# Patient Record
Sex: Female | Born: 1960 | Race: White | Hispanic: No | Marital: Married | Smoking: Never smoker
Health system: Southern US, Community
[De-identification: ages and names within clinical notes are randomized; demographics above are authoritative.]

## PROBLEM LIST (undated history)

## (undated) DIAGNOSIS — Q2112 Patent foramen ovale: Secondary | ICD-10-CM

## (undated) DIAGNOSIS — G629 Polyneuropathy, unspecified: Secondary | ICD-10-CM

## (undated) DIAGNOSIS — D649 Anemia, unspecified: Secondary | ICD-10-CM

## (undated) DIAGNOSIS — Q211 Atrial septal defect: Secondary | ICD-10-CM

## (undated) DIAGNOSIS — I639 Cerebral infarction, unspecified: Secondary | ICD-10-CM

## (undated) DIAGNOSIS — I499 Cardiac arrhythmia, unspecified: Secondary | ICD-10-CM

## (undated) DIAGNOSIS — R55 Syncope and collapse: Secondary | ICD-10-CM

## (undated) HISTORY — DX: Patent foramen ovale: Q21.12

## (undated) HISTORY — DX: Syncope and collapse: R55

## (undated) HISTORY — DX: Polyneuropathy, unspecified: G62.9

## (undated) HISTORY — DX: Cardiac arrhythmia, unspecified: I49.9

## (undated) HISTORY — DX: Atrial septal defect: Q21.1

## (undated) HISTORY — DX: Cerebral infarction, unspecified: I63.9

## (undated) HISTORY — PX: TONSILLECTOMY: SUR1361

## (undated) HISTORY — DX: Anemia, unspecified: D64.9

---

## 2007-12-04 DIAGNOSIS — I639 Cerebral infarction, unspecified: Secondary | ICD-10-CM

## 2007-12-04 HISTORY — DX: Cerebral infarction, unspecified: I63.9

## 2008-01-31 ENCOUNTER — Ambulatory Visit: Payer: Self-pay | Admitting: Internal Medicine

## 2008-03-07 ENCOUNTER — Other Ambulatory Visit: Payer: Self-pay

## 2008-03-07 ENCOUNTER — Inpatient Hospital Stay: Payer: Self-pay | Admitting: Internal Medicine

## 2010-09-20 ENCOUNTER — Emergency Department: Payer: Self-pay | Admitting: Unknown Physician Specialty

## 2010-10-06 ENCOUNTER — Ambulatory Visit: Payer: Self-pay | Admitting: Unknown Physician Specialty

## 2010-10-11 LAB — PATHOLOGY REPORT

## 2010-10-16 ENCOUNTER — Ambulatory Visit: Payer: Self-pay | Admitting: Unknown Physician Specialty

## 2010-10-17 ENCOUNTER — Ambulatory Visit: Payer: Self-pay | Admitting: Family Medicine

## 2010-11-16 ENCOUNTER — Ambulatory Visit: Payer: Self-pay | Admitting: Unknown Physician Specialty

## 2013-04-16 ENCOUNTER — Ambulatory Visit: Payer: Self-pay | Admitting: Obstetrics and Gynecology

## 2013-04-16 LAB — CBC
HCT: 34.7 % — ABNORMAL LOW (ref 35.0–47.0)
MCHC: 32.7 g/dL (ref 32.0–36.0)
MCV: 81 fL (ref 80–100)
RBC: 4.3 10*6/uL (ref 3.80–5.20)
RDW: 14.6 % — ABNORMAL HIGH (ref 11.5–14.5)

## 2013-04-28 ENCOUNTER — Ambulatory Visit: Payer: Self-pay | Admitting: Obstetrics and Gynecology

## 2014-03-29 ENCOUNTER — Ambulatory Visit: Payer: Self-pay | Admitting: Family Medicine

## 2014-08-11 ENCOUNTER — Telehealth: Payer: Self-pay | Admitting: *Deleted

## 2014-08-11 DIAGNOSIS — I471 Supraventricular tachycardia: Secondary | ICD-10-CM

## 2014-08-11 DIAGNOSIS — R0602 Shortness of breath: Secondary | ICD-10-CM

## 2014-08-11 NOTE — Telephone Encounter (Signed)
Per Dr Arida 

## 2014-08-19 ENCOUNTER — Ambulatory Visit (INDEPENDENT_AMBULATORY_CARE_PROVIDER_SITE_OTHER): Payer: Federal, State, Local not specified - PPO | Admitting: Cardiovascular Disease

## 2014-08-19 ENCOUNTER — Encounter: Payer: Self-pay | Admitting: Cardiovascular Disease

## 2014-08-19 ENCOUNTER — Other Ambulatory Visit (INDEPENDENT_AMBULATORY_CARE_PROVIDER_SITE_OTHER): Payer: Federal, State, Local not specified - PPO

## 2014-08-19 ENCOUNTER — Other Ambulatory Visit: Payer: Self-pay

## 2014-08-19 VITALS — BP 114/82 | HR 64 | Ht 70.0 in | Wt 195.0 lb

## 2014-08-19 DIAGNOSIS — Q2112 Patent foramen ovale: Secondary | ICD-10-CM

## 2014-08-19 DIAGNOSIS — R002 Palpitations: Secondary | ICD-10-CM

## 2014-08-19 DIAGNOSIS — I498 Other specified cardiac arrhythmias: Secondary | ICD-10-CM

## 2014-08-19 DIAGNOSIS — Q211 Atrial septal defect: Secondary | ICD-10-CM

## 2014-08-19 DIAGNOSIS — R0602 Shortness of breath: Secondary | ICD-10-CM

## 2014-08-19 DIAGNOSIS — Q2111 Secundum atrial septal defect: Secondary | ICD-10-CM

## 2014-08-19 DIAGNOSIS — I471 Supraventricular tachycardia: Secondary | ICD-10-CM

## 2014-08-19 NOTE — Patient Instructions (Signed)
Your physician has recommended that you wear a 30 day event monitor. Event monitors are medical devices that record the heart's electrical activity. Doctors most often [place] these monitors to diagnose arrhythmias. Arrhythmias are problems with the speed or rhythm of the heartbeat. The monitor is a small, portable device. You can wear one while you do your normal daily activities. This is usually used to diagnose what is causing palpitations/syncope (passing out).  Your physician has recommended you make the following change in your medication:  Stop Toprol   Your physician recommends that you schedule a follow-up appointment in:  As needed

## 2014-08-24 ENCOUNTER — Encounter: Payer: Self-pay | Admitting: Cardiovascular Disease

## 2014-08-27 ENCOUNTER — Encounter: Payer: Self-pay | Admitting: Cardiovascular Disease

## 2014-08-27 DIAGNOSIS — Q2112 Patent foramen ovale: Secondary | ICD-10-CM | POA: Insufficient documentation

## 2014-08-27 DIAGNOSIS — Q211 Atrial septal defect: Secondary | ICD-10-CM | POA: Insufficient documentation

## 2014-08-27 DIAGNOSIS — R002 Palpitations: Secondary | ICD-10-CM | POA: Insufficient documentation

## 2014-08-27 NOTE — Assessment & Plan Note (Signed)
This was noted during previous workup of stroke in 2009. However, this was not evident on today's echocardiogram. Thus, if PFO is present, it's likely small.

## 2014-08-27 NOTE — Progress Notes (Signed)
   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 She is here today for evaluation of intermittent tachycardia and palpitations. She suffered from an MCA stroke in 2009 of unclear etiology. However, she was found to have a PFO. She did not undergo goes up. As a result of stroke, she has continued symptoms of left-sided numbness and occasionally decreased concentration. Over the last few months, she has been having intermittent episodes of palpitations mostly at rest with mild dizziness but no syncope or presyncope. No chest pain or lower extremity edema. These episodes did not happen on a daily basis. She has been using metoprolol from her husband with some relief of symptoms but not complete resolution. She describes shortness of breath during these episodes of tachycardia. There is no family history of coronary artery disease or sudden death.    No Known Allergies   No current outpatient prescriptions on file prior to visit.   No current facility-administered medications on file prior to visit.     Past Medical History  Diagnosis Date  . Syncope and collapse   . Arrhythmia   . Anemia   . Neuropathy   . Stroke 2009  . PFO (patent foramen ovale)      Past Surgical History  Procedure Laterality Date  . Tonsillectomy       History reviewed. No pertinent family history.   History   Social History  . Marital Status: Married    Spouse Name: N/A    Number of Children: N/A  . Years of Education: N/A   Occupational History  . Not on file.   Social History Main Topics  . Smoking status: Never Smoker   . Smokeless tobacco: Not on file  . Alcohol Use: No  . Drug Use: No  . Sexual Activity: Not on file   Other Topics Concern  . Not on file   Social History Narrative  . No narrative on file     ROS A 10 point review of system was performed. It is negative other than that mentioned in the history of present illness.   PHYSICAL EXAM   BP 114/82   Pulse 64  Ht  (1.778 m)  Wt 195 lb (88.451 kg)  BMI 27.98 kg/m2 Constitutional: She is oriented to person, place, and time. She appears well-developed and well-nourished. No distress.  HENT: No nasal discharge.  Head: Normocephalic and atraumatic.  Eyes: Pupils are equal and round. No discharge.  Neck: Normal range of motion. Neck supple. No JVD present. No thyromegaly present.  Cardiovascular: Normal rate, regular rhythm, normal heart sounds. Exam reveals no gallop and no friction rub. No murmur heard.  Pulmonary/Chest: Effort normal and breath sounds normal. No stridor. No respiratory distress. She has no wheezes. She has no rales. She exhibits no tenderness.  Abdominal: Soft. Bowel sounds are normal. She exhibits no distension. There is no tenderness. There is no rebound and no guarding.  Musculoskeletal: Normal range of motion. She exhibits no edema and no tenderness.  Neurological: She is alert and oriented to person, place, and time. Coordination normal.  Skin: Skin is warm and dry. No rash noted. She is not diaphoretic. No erythema. No pallor.  Psychiatric: She has a normal mood and affect. Her behavior is normal. Judgment and thought content normal.     EKG: Normal sinus rhythm.   ASSESSMENT AND PLAN

## 2014-08-27 NOTE — Assessment & Plan Note (Signed)
The symptoms are suggestive of paroxysmal supraventricular tachycardia. The episodes are not happening frequently enough to be captured on a Holter monitor. Thus, I recommend a 30 day outpatient telemetry. Given her previous history of PFO, an echocardiogram was done and showed no structural heart abnormalities. This overall was normal.

## 2014-08-30 ENCOUNTER — Telehealth: Payer: Self-pay | Admitting: *Deleted

## 2014-08-30 NOTE — Telephone Encounter (Signed)
Patient stated that with her current work schedule she does not think the event monitor will work for her  She has no time to charge it during her day  Patient works 1 week on then 1 week off  Discussed patient issue with Dr. Kirke Corin  Dr. Kirke Corin instructed patient to wear holter on her weeks off for two weeks And not to wear it on her 2 weeks on   Patient verbalized understanding

## 2014-08-31 DIAGNOSIS — I498 Other specified cardiac arrhythmias: Secondary | ICD-10-CM | POA: Diagnosis not present

## 2014-10-11 ENCOUNTER — Ambulatory Visit (INDEPENDENT_AMBULATORY_CARE_PROVIDER_SITE_OTHER): Payer: Federal, State, Local not specified - PPO

## 2014-10-11 ENCOUNTER — Telehealth: Payer: Self-pay | Admitting: *Deleted

## 2014-10-11 ENCOUNTER — Other Ambulatory Visit: Payer: Self-pay

## 2014-10-11 DIAGNOSIS — R002 Palpitations: Secondary | ICD-10-CM

## 2014-10-11 NOTE — Telephone Encounter (Signed)
Informed patient per Dr. Kirke CorinArida event monitor showed: Normal sinus rhythm with no significant arrhythmia Patient verbalized understanding

## 2014-11-08 ENCOUNTER — Ambulatory Visit: Payer: Self-pay | Admitting: Otolaryngology

## 2014-12-30 ENCOUNTER — Ambulatory Visit: Payer: Self-pay | Admitting: Obstetrics and Gynecology

## 2014-12-30 LAB — COMPREHENSIVE METABOLIC PANEL WITH GFR
Albumin: 3.6 g/dL
Alkaline Phosphatase: 81 U/L
Anion Gap: 5 — ABNORMAL LOW
BUN: 23 mg/dL — ABNORMAL HIGH
Bilirubin,Total: 0.6 mg/dL
Calcium, Total: 8.5 mg/dL
Chloride: 106 mmol/L
Co2: 30 mmol/L
Creatinine: 0.82 mg/dL
EGFR (African American): >60
EGFR (Non-African Amer.): >60
Glucose: 93 mg/dL
Osmolality: 285
Potassium: 3.7 mmol/L
SGOT(AST): 27 U/L
SGPT (ALT): 18 U/L
Sodium: 141 mmol/L
Total Protein: 7 g/dL

## 2014-12-30 LAB — CBC
HCT: 42.1 %
HGB: 13.4 g/dL
MCH: 29.8 pg
MCHC: 31.8 g/dL — ABNORMAL LOW
MCV: 94 fL
Platelet: 250 x10 3/mm 3
RBC: 4.49 X10 6/mm 3
RDW: 12.8 %
WBC: 6.5 x10 3/mm 3

## 2014-12-31 LAB — RAPID HIV-1/2 QL/CONFIRM: HIV-1/2,Rapid Ql: NEGATIVE

## 2015-01-03 ENCOUNTER — Ambulatory Visit: Payer: Self-pay | Admitting: Obstetrics and Gynecology

## 2015-01-04 LAB — HCG, QUANTITATIVE, PREGNANCY

## 2015-03-25 NOTE — Op Note (Signed)
PATIENT NAME:  Amber Roberson, Merna MR#:  [id number] DATE OF BIRTH:  March 17, 1961  DATE OF PROCEDURE:  04/28/2013  PREOPERATIVE DIAGNOSES: 1.  Menorrhagia.  2.  Endometrial polyps.   POSTOPERATIVE DIAGNOSES: 1.  Menorrhagia.  2.  Endometrial polyps.  3.  Pelvic organ prolapse.   OPERATIVE PROCEDURES: 1.  Hysteroscopy.  2.  Dilation and curettage of the endometrium.   SURGEON: Herold HarmsMartin A Lestine Rahe, MD   FIRST ASSISTANT:  None.   ANESTHESIA: General endotracheal.   INDICATIONS: The patient is a 54 year old married white female, para 2-0-0-2, last menstrual period 04/10/2013, presents for surgical management of menorrhagia to anemia. The patient has had heavy cyclic bleeding on a monthly basis with development of anemia requiring iron supplementation. Preoperative ultrasound demonstrated a thickened endometrium with multiple endometrial polyps.   FINDINGS AT SURGERY: Revealed the uterus to be anteverted, mobile, and with a normal uterine descensus to within 2 cm of the introitus. The bony pelvis is gynecoid. The patient is an excellent transvaginal hysterectomy candidate. On hysteroscopy, multiple endometrial polyps were seen.   DESCRIPTION OF PROCEDURE: The patient was brought to the Operating Room where she was placed in the supine position. General anesthesia was induced without difficulty. She was placed in dorsal lithotomy position using the candy cane stirrups. A Betadine perineal intravaginal prep and drape was performed in the standard fashion. A red Robinson catheter was used to drain 50 mL of urine from the bladder. A weighted speculum was placed into the vagina, and a single-tooth tenaculum was placed on the anterior lip of the cervix. The uterus was sounded to 8 cm. It was noted to be anteverted. The [facility] Hysteroscope using lactated Ringer's as irrigant was used for the hysteroscopy.  The endocervical canal was dilated with Hanks dilators up to a #20 [place] caliber. The [facility] Hysteroscope  verified multiple endometrial polyps which were photo documented. The curettage was then performed with both smooth and serrated curettes. Stone polyp forceps were used to extract endometrial polyps. Repeat hysteroscopy revealed excellent removal of tissue with minimal tissue left behind. Upon completion of the procedure, all instrumentation was removed from the vagina. The patient was then awakened, extubated, and taken to the recovery room in satisfactory condition.   ESTIMATED BLOOD LOSS: 25 mL.   COMPLICATIONS: None.   COUNTS:  All instruments, needle, and sponge counts were verified as correct.   ____________________________ Prentice DockerMartin A. Shalan Neault, MD mad:cb D: 04/28/2013 14:15:01 ET T: 04/28/2013 16:02:32 ET JOB#: [id number]  cc: Daphine DeutscherMartin A. Adilynne Fitzwater, MD, <Dictator> Prentice DockerMARTIN A Jillann Charette MD ELECTRONICALLY SIGNED 05/12/2013 15:05

## 2015-03-28 LAB — SURGICAL PATHOLOGY

## 2015-04-03 NOTE — Op Note (Signed)
PATIENT NAME:  Amber Roberson, Amber Roberson MR#:  [id number] DATE OF BIRTH:  1960/12/28  DATE OF PROCEDURE:  01/03/2015  PREOPERATIVE DIAGNOSES: 1.  Menorrhagia to anemia.  2.  Bilateral labia majora cysts.   POSTOPERATIVE DIAGNOSES:  1.  Menorrhagia to anemia.  2.  Bilateral labia majora cysts.   OPERATIVE PROCEDURE:  1.  Hysteroscopy/dilation and curettage with NovaSure endometrial ablation.  2.  Excisional biopsy of vulvar lesions, right and left labia majora.   SURGEON: Prentice DockerMartin A. Alejos Reinhardt, M.D.   FIRST ASSISTANT: Mare LoanJan Beard, PA-S  ANESTHESIA: General.   INDICATIONS: The patient is a 54 year old white female with history of menorrhagia to anemia who presents for surgical management of this condition. Preoperative ultrasound did demonstrate a small possible submucosal fibroid. Preoperative endometrial biopsy was benign.   FINDINGS AT SURGERY: Revealed a gynecoid pelvis. The uterus was sounded to 8.5 cm. There was good descensus with tenaculum traction to within several centimeters of the introitus. On hysteroscopy, no abnormal lesions were identified intraoperatively. The right and left labia majora contained multiple inclusion cysts and sebaceous cysts which had to be excised. The extent of the incision on the right was 6 cm. The extent of the excision on the left labia majora was 4 cm.   DESCRIPTION OF THE PROCEDURE: The patient was brought to the operating room where she was placed in the supine position. General anesthesia was induced without difficulty. She was placed in the dorsal lithotomy position using candy-cane stirrups. A Betadine perineal, intravaginal prep and drape was performed in standard fashion. A red Robison catheter was used to drain 50 mL of urine from the bladder. A weighted speculum was placed into the vagina, and a single-tooth tenaculum was placed on the anterior lip of the cervix. The uterus was sounded to 8.5 cm. The cervix was dilated using Hegar dilators up to a #8-French.  The cervical length was 4 cm. Hysteroscopy was performed with the [facility] hysteroscope using lactated Ringer's as irrigant. The above-noted findings were photo documented. Next, the NovaSure instrument was opened, placed, and cavity test performed, which was successful. Ablation was then performed through standard technique with excellent burn of the endometrial cavity. The cavity length was 4.5 cm. The cavity width was 4.3 cm. The power was 109 joules. Duration of the burn was 1 minute and 19 seconds. Following completion of the NovaSure ablation, repeat hysteroscopy demonstrated a good thermal burn of the endometrial cavity. Following completion of the hysteroscopy, the excisional biopsies of the vulva were performed. The right labia majora was infiltrated with 1% lidocaine with 1:100,000 epinephrine. Incision was made through the skin and actually involved several of the cysts. Cyst sebum was evacuated. The cyst wall was then excised using sharp dissection. This was sent to pathology. The total length of the incision on the right labia majora was 6 cm. Hemostasis was maintained with needle tip Bovie cautery. Good hemostasis was noted. The 3-0 Vicryl suture was then used with a running continuous stitch to reapproximate the subcutaneous tissues. This was followed by a 4-0 Vicryl suture, continuous running closure of the skin. Dermabond glue was placed over this incision and hemostasis was intact. A similar procedure was performed on the left labia majora. An elliptical excision incorporating approximately 4 cm in length was made. The skin and cyst were removed intact. These were sent to pathology. The incision was then closed with a 4-0 Vicryl Keith needle in a simple running manner. Dermabond glue was also placed over this incision. Upon completion of the  procedure, the patient was awakened, mobilized, and taken to the recovery room in satisfactory condition.   ESTIMATED BLOOD LOSS: Minimal.   COMPLICATIONS:  None.   COUNTS: All instrument, needle, and sponge counts were verified as correct.  ____________________________ Prentice Docker. Anslee Micheletti, MD mad:sb D: 01/03/2015 11:27:50 ET T: 01/03/2015 11:42:46 ET JOB#: [id number]  cc: Daphine Deutscher A. Daoud Lobue, MD, <Dictator> Prentice Docker Jshon Ibe MD ELECTRONICALLY SIGNED 01/10/2015 13:01

## 2015-11-16 ENCOUNTER — Other Ambulatory Visit: Payer: Self-pay | Admitting: Family Medicine

## 2015-11-16 DIAGNOSIS — Z1231 Encounter for screening mammogram for malignant neoplasm of breast: Secondary | ICD-10-CM

## 2015-12-07 ENCOUNTER — Ambulatory Visit: Payer: Federal, State, Local not specified - PPO

## 2015-12-09 ENCOUNTER — Ambulatory Visit
Admission: RE | Admit: 2015-12-09 | Discharge: 2015-12-09 | Disposition: A | Discharge status: Home / Self Care | Payer: Federal, State, Local not specified - PPO | Source: Ambulatory Visit | Attending: Family Medicine | Admitting: Family Medicine

## 2015-12-09 DIAGNOSIS — Z1231 Encounter for screening mammogram for malignant neoplasm of breast: Secondary | ICD-10-CM

## 2016-01-17 ENCOUNTER — Other Ambulatory Visit: Payer: Self-pay | Admitting: Family Medicine

## 2016-01-17 DIAGNOSIS — R928 Other abnormal and inconclusive findings on diagnostic imaging of breast: Secondary | ICD-10-CM

## 2016-01-18 ENCOUNTER — Ambulatory Visit
Admission: RE | Admit: 2016-01-18 | Discharge: 2016-01-18 | Disposition: A | Discharge status: Home / Self Care | Payer: Federal, State, Local not specified - PPO | Source: Ambulatory Visit | Attending: Family Medicine | Admitting: Family Medicine

## 2016-01-18 DIAGNOSIS — R928 Other abnormal and inconclusive findings on diagnostic imaging of breast: Secondary | ICD-10-CM

## 2016-01-18 DIAGNOSIS — N6489 Other specified disorders of breast: Secondary | ICD-10-CM | POA: Diagnosis present

## 2016-09-11 ENCOUNTER — Other Ambulatory Visit: Payer: Self-pay | Admitting: Family Medicine

## 2016-09-11 DIAGNOSIS — R928 Other abnormal and inconclusive findings on diagnostic imaging of breast: Secondary | ICD-10-CM

## 2016-09-13 ENCOUNTER — Ambulatory Visit
Admission: RE | Admit: 2016-09-13 | Discharge: 2016-09-13 | Disposition: A | Discharge status: Home / Self Care | Payer: Federal, State, Local not specified - PPO | Source: Ambulatory Visit | Attending: Family Medicine | Admitting: Family Medicine

## 2016-09-13 DIAGNOSIS — R928 Other abnormal and inconclusive findings on diagnostic imaging of breast: Secondary | ICD-10-CM

## 2017-04-03 ENCOUNTER — Other Ambulatory Visit: Payer: Self-pay | Admitting: Family Medicine

## 2017-04-03 DIAGNOSIS — M545 Low back pain, unspecified: Secondary | ICD-10-CM

## 2017-04-03 DIAGNOSIS — M79605 Pain in left leg: Secondary | ICD-10-CM

## 2017-04-08 ENCOUNTER — Ambulatory Visit
Admission: RE | Admit: 2017-04-08 | Discharge: 2017-04-08 | Disposition: A | Discharge status: Home / Self Care | Payer: Federal, State, Local not specified - PPO | Source: Ambulatory Visit | Attending: Family Medicine | Admitting: Family Medicine

## 2017-04-08 DIAGNOSIS — M545 Low back pain: Secondary | ICD-10-CM | POA: Insufficient documentation

## 2017-04-08 DIAGNOSIS — M5136 Other intervertebral disc degeneration, lumbar region: Secondary | ICD-10-CM | POA: Diagnosis not present

## 2017-04-08 DIAGNOSIS — M79605 Pain in left leg: Secondary | ICD-10-CM

## 2017-05-07 ENCOUNTER — Other Ambulatory Visit: Payer: Self-pay | Admitting: Neurology

## 2017-05-07 DIAGNOSIS — I69319 Unspecified symptoms and signs involving cognitive functions following cerebral infarction: Secondary | ICD-10-CM

## 2017-05-07 DIAGNOSIS — R292 Abnormal reflex: Secondary | ICD-10-CM

## 2017-05-14 ENCOUNTER — Other Ambulatory Visit: Payer: Federal, State, Local not specified - PPO

## 2017-05-14 ENCOUNTER — Ambulatory Visit: Payer: Federal, State, Local not specified - PPO

## 2017-05-20 ENCOUNTER — Ambulatory Visit
Admission: RE | Admit: 2017-05-20 | Discharge: 2017-05-20 | Disposition: A | Discharge status: Home / Self Care | Payer: Federal, State, Local not specified - PPO | Source: Ambulatory Visit | Attending: Neurology | Admitting: Neurology

## 2017-05-20 DIAGNOSIS — M5030 Other cervical disc degeneration, unspecified cervical region: Secondary | ICD-10-CM | POA: Insufficient documentation

## 2017-05-20 DIAGNOSIS — I69319 Unspecified symptoms and signs involving cognitive functions following cerebral infarction: Secondary | ICD-10-CM

## 2017-05-20 DIAGNOSIS — M5021 Other cervical disc displacement,  high cervical region: Secondary | ICD-10-CM | POA: Insufficient documentation

## 2017-05-20 DIAGNOSIS — R292 Abnormal reflex: Secondary | ICD-10-CM

## 2017-11-20 ENCOUNTER — Other Ambulatory Visit: Payer: Self-pay | Admitting: Internal Medicine

## 2017-11-20 DIAGNOSIS — Z1231 Encounter for screening mammogram for malignant neoplasm of breast: Secondary | ICD-10-CM

## 2017-11-28 ENCOUNTER — Other Ambulatory Visit: Payer: Self-pay | Admitting: Orthopedic Surgery

## 2017-11-28 DIAGNOSIS — M5416 Radiculopathy, lumbar region: Secondary | ICD-10-CM

## 2017-12-04 ENCOUNTER — Ambulatory Visit
Admission: RE | Admit: 2017-12-04 | Discharge: 2017-12-04 | Disposition: A | Discharge status: Home / Self Care | Payer: Federal, State, Local not specified - PPO | Source: Ambulatory Visit | Attending: Orthopedic Surgery | Admitting: Orthopedic Surgery

## 2017-12-04 DIAGNOSIS — M47896 Other spondylosis, lumbar region: Secondary | ICD-10-CM | POA: Diagnosis not present

## 2017-12-04 DIAGNOSIS — M5416 Radiculopathy, lumbar region: Secondary | ICD-10-CM

## 2017-12-04 DIAGNOSIS — M1288 Other specific arthropathies, not elsewhere classified, other specified site: Secondary | ICD-10-CM | POA: Insufficient documentation

## 2017-12-04 DIAGNOSIS — M5126 Other intervertebral disc displacement, lumbar region: Secondary | ICD-10-CM | POA: Diagnosis not present

## 2017-12-16 ENCOUNTER — Ambulatory Visit
Admission: RE | Admit: 2017-12-16 | Discharge: 2017-12-16 | Disposition: A | Discharge status: Home / Self Care | Payer: Federal, State, Local not specified - PPO | Source: Ambulatory Visit | Attending: Internal Medicine | Admitting: Internal Medicine

## 2017-12-16 DIAGNOSIS — Z1231 Encounter for screening mammogram for malignant neoplasm of breast: Secondary | ICD-10-CM | POA: Diagnosis present

## 2018-02-20 ENCOUNTER — Ambulatory Visit: Payer: Federal, State, Local not specified - PPO | Attending: Orthopedic Surgery

## 2018-02-20 ENCOUNTER — Other Ambulatory Visit: Payer: Self-pay

## 2018-02-20 DIAGNOSIS — M545 Low back pain: Secondary | ICD-10-CM | POA: Insufficient documentation

## 2018-02-20 DIAGNOSIS — M5416 Radiculopathy, lumbar region: Secondary | ICD-10-CM | POA: Insufficient documentation

## 2018-02-20 DIAGNOSIS — G8929 Other chronic pain: Secondary | ICD-10-CM | POA: Diagnosis present

## 2018-02-20 DIAGNOSIS — M6281 Muscle weakness (generalized): Secondary | ICD-10-CM | POA: Diagnosis present

## 2018-02-20 NOTE — Therapy (Signed)
[facility] MAIN [facility] SERVICES [address] [place], [place], [address] Phone: [phone]   Fax:  [phone]  Physical Therapy Evaluation  Patient Details  Name: Amber Roberson MRN: [id number] Date of Birth: [birth year] Referring Provider: Dr. Martha Clan   Encounter Date: 02/20/2018  PT End of Session - 02/20/18 1652    Visit Number  1    Number of Visits  12    Date for PT Re-Evaluation  04/03/18    PT Start Time  1347    PT Stop Time  1430    PT Time Calculation (min)  43 min    Activity Tolerance  Patient limited by pain    Behavior During Therapy  Anxious;Agitated       Past Medical History:  Diagnosis Date  . Anemia   . Arrhythmia   . Neuropathy   . PFO (patent foramen ovale)   . Stroke [facility]) 2009  . Syncope and collapse     Past Surgical History:  Procedure Laterality Date  . TONSILLECTOMY      There were no vitals filed for this visit.     OUTCOME MEASURES: TEST Outcome Interpretation  5 times sit<>stand 12 sec >56 yo, >15 sec indicates increased risk for falls  Worst VAS 7/10   MODI 34% Mod disability  LEFS 60/80                 [facility] PT Assessment - 02/20/18 0001      Assessment   Medical Diagnosis  lumbar radiculopathy    Referring Provider  Dr. Martha Clan    Onset Date/Surgical Date  11/22/17    Hand Dominance  Right    Next MD Visit  4-5 weeks    Prior Therapy  not for back pain      Precautions   Precautions  Other (comment) history of stroke, sensation limited in LLE      Restrictions   Weight Bearing Restrictions  No      Balance Screen   Has the patient fallen in the past 6 months  -- patient did not answer    Has the patient had a decrease in activity level because of a fear of falling?   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       Cognition   Overall Cognitive Status  History of cognitive impairments - at baseline    Attention  Alternating    Alternating Attention  Impaired    Memory  Impaired    Memory Impairment  Decreased short term memory    Behaviors  Impulsive;Verbal agitation;Poor frustration tolerance;Perseveration Unable to answer questions, continue repeating      Observation/Other Assessments   Observations  Agitated, tearful when discussing pain, unable to sit still    Skin Integrity  intact    Focus on Therapeutic Outcomes (FOTO)   MODI, LEFS      Sensation   Light Touch  Impaired by gross assessment    Additional Comments  LLE sensation deficits      Coordination   Gross Motor Movements are Fluid and Coordinated  Yes  Posture/Postural Control   Posture/Postural Control  Postural limitations    Postural Limitations  Rounded Shoulders;Forward head;Weight shift left      Tone   Assessment Location  Left Lower Extremity      ROM / Strength   AROM / PROM / Strength  AROM;Strength      AROM   Overall AROM   Within functional limits for tasks performed;Deficits;Due to pain    AROM Assessment Site  Lumbar    Lumbar Flexion  45 painful    Lumbar Extension  10    Lumbar - Right Side Bend  25    Lumbar - Left Side Bend  20 painful    Lumbar - Right Rotation  19 painful    Lumbar - Left Rotation  25       Strength   Overall Strength  Deficits    Strength Assessment Site  Hip;Knee;Ankle;Lumbar    Right/Left Hip  Right;Left    Right Hip Flexion  4-/5    Right Hip Extension  2+/5    Right Hip ABduction  4-/5    Right Hip ADduction  4-/5    Left Hip Flexion  3+/5    Left Hip Extension  2/5    Left Hip ABduction  3+/5    Left Hip ADduction  3+/5    Right/Left Knee  Right;Left    Right Knee Flexion  4-/5    Right Knee Extension  4-/5    Left Knee Flexion  3+/5     Left Knee Extension  3+/5    Right/Left Ankle  Right;Left    Right Ankle Dorsiflexion  4-/5    Right Ankle Plantar Flexion  4-/5    Left Ankle Dorsiflexion  3+/5    Left Ankle Plantar Flexion  3+/5    Lumbar Flexion  2+/5    Lumbar Extension  2+/5      Flexibility   Soft Tissue Assessment /Muscle Length  yes    Hamstrings  limited bilaterally with R>L    Quadriceps  iliopsoas and quadricep tight bilaterally       Palpation   Spinal mobility  L4-5 and L5 S1 R UPA reproduce pain all lumbosacral Grade I-III UPA and CPAs hypomobile      Special Tests    Special Tests  Sacrolliac Tests;Leg LengthTest;Non-Organic Signs;Hip Special Tests;Lumbar    Lumbar Tests  FABER test;Slump Test;Straight Leg Raise    Sacroiliac Tests   Sacral Compression    Leg length test   Apparent;True    Non-Organic Signs  other changing of symptom location,    Hip Special Tests   Luisa Hart (FABER) Test;Hip Scouring      FABER test   findings  Negative    Side  Right      Slump test   Findings  Positive    Side  Right      Straight Leg Raise   Findings  Negative    Side   Right      Pelvic Dictraction   Findings  Negative      Pelvic Compression   Findings  Negative      Sacral Compression   Findings  -- painful one time, not painful the other time.       True   Length  equal      Apparent   Length  equal      Luisa Hart (FABER) Test   Findings  Negative      Hip Scouring   Findings  Negative      Bed Mobility   Bed Mobility  Supine to Sit;Sit to Supine    Supine to Sit  6: Modified independent (Device/Increase time) painful    Sit to Supine  6: Modified independent (Device/Increase time) painful      Transfers   Transfers  Sit to Stand;Stand to Sit    Sit to Stand  6: Modified independent (Device/Increase time);With upper extremity assist painful    Five time sit to stand comments   12 seconds    Stand to Sit  6: Modified independent (Device/Increase time) painful       Ambulation/Gait   Ambulation/Gait  Yes    Ambulation/Gait Assistance  7: Independent    Ambulation Distance (Feet)  10 Feet    Assistive device  None    Gait Pattern  Step-through pattern;Decreased arm swing - left;Decreased stride length;Decreased weight shift to right;Decreased trunk rotation    Ambulation Surface  Level;Indoor      LLE Tone   LLE Tone  Mild             Objective measurements completed on examination: See above findings.              PT Education - 02/20/18 1652    Education provided  Yes    Education Details  POC, neurological flossing    Person(s) Educated  Patient    Methods  Explanation;Demonstration;Verbal cues    Comprehension  Verbalized understanding;Returned demonstration;Need further instruction       PT Short Term Goals - 02/20/18 1659      PT SHORT TERM GOAL #1   Title  Patient will be independent in home exercise program to improve strength/mobility for better functional independence with ADLs.    Baseline  HEP to be given    Time  2    Period  Weeks    Status  New    Target Date  03/06/18      PT SHORT TERM GOAL #2   Title   Patient (< 69 years old) will complete five times sit to stand test in < 10 seconds indicating an increased LE strength and improved balance.    Baseline  3/21: 12 seconds    Time  2    Period  Weeks    Status  New    Target Date  03/06/18      PT SHORT TERM GOAL #3   Title  Patient will report a worst pain of 5/10 on VAS in R lumbosacral region  to improve tolerance with ADLs and reduced symptoms with activities.     Baseline  3/21: 7/10    Time  2    Period  Weeks    Status  New    Target Date  03/06/18      PT SHORT TERM GOAL #4   Title  Patient will increase lumbar extension strength to at least 3+/5 as to improve gross strength for sitting/standing tolerance with better erect posture for increased tolerance with ADLs.     Baseline  2+/5     Time  2    Period  Weeks    Status  New     Target Date  03/06/18        PT Long Term Goals - 02/20/18 1701      PT LONG TERM GOAL #1   Title  Patient will report a worst pain of 3/10 on VAS in R lumbosacral region  to improve tolerance with  ADLs and reduced symptoms with activities.     Baseline  3/21: 7/10    Time  6    Period  Weeks    Status  New    Target Date  04/03/18      PT LONG TERM GOAL #2   Title  Patient will tolerate sitting unsupported demonstrating erect sitting posture with minimal weight shift for 15+ minutes with maximum of 4/10 back pain to demonstrate improved back extensor strength and improved sitting tolerance.     Baseline  unable to tolerate    Time  6    Period  Weeks    Status  New    Target Date  04/03/18      PT LONG TERM GOAL #3   Title   Patient will reduce modified Oswestry score to <20 as to demonstrate minimal disability with ADLs including improved sleeping tolerance, walking/sitting tolerance etc for better mobility with ADLs.     Baseline  3/21: 34%    Time  6    Period  Weeks    Status  New    Target Date  04/03/18      PT LONG TERM GOAL #4   Title  Patient will demonstrate 4+/5 strength of lumbar and RLE to improve gross strength for sitting/standing tolerance with better erect posture for increased tolerance with ADLs.     Baseline  2+/5 lumbar, 4-/5 RLE    Time  6    Period  Weeks    Status  New    Target Date  02/20/18             Plan - 02/20/18 1653    Clinical Impression Statement  Patient is a pleasant 57 year old female who presents with lumbar radiculopathy and R low back pain.  Patient has a history of Stroke, PFO, and central pain syndrome with memory deficits. Outcomes of this evaluation may be slightly skewed due to patient's mental status and high anxiety level due to pain. L4-5, and L5 -S1 reproduced pain upon mobilization. Hypomobility of lumbosacral spine combined with excessive lumbar paraspinal muscle guarding and weak LE and postural musculature result  in impaired posture and increased pain. Neural gliding was positive in RLE. MODI=34%, LEFS 60/80, VAS worst pain 7/10. Patient would benefit from skilled physical therapy to reduce pain and radiating symptoms for improved quality of life.    History and Personal Factors relevant to plan of care:  This patient presents with 3, personal factors/ comorbidities , and  4  body elements including body structures and functions, activity limitations and or participation restrictions. Patient's condition is unstable    Clinical Presentation  Unstable    Clinical Presentation due to:  Cogntive component combined with fluctuating and radiating pain, hx of CVA     Clinical Decision Making  High    Rehab Potential  Fair    Clinical Impairments Affecting Rehab Potential  (+) high education in medical background, motivated (-) history of short term memory deficit, chronicity of pain     PT Frequency  2x / week    PT Duration  6 weeks    PT Treatment/Interventions  ADLs/Self Care Home Management;Aquatic Therapy;Electrical Stimulation;Iontophoresis 4mg /ml Dexamethasone;Moist Heat;Traction;Ultrasound;Fluidtherapy;Therapeutic activities;Functional mobility training;Stair training;Gait training;Therapeutic exercise;Balance training;Neuromuscular re-education;Patient/family education;Manual techniques;Passive range of motion;Dry needling;Energy conservation;Taping    PT Next Visit Plan  mobilize spine, neural glides, LE strength and core strength, give HEP    Consulted and Agree with Plan of Care  Patient  Patient will benefit from skilled therapeutic intervention in order to improve the following deficits and impairments:  Abnormal gait, Decreased activity tolerance, Decreased endurance, Decreased mobility, Decreased range of motion, Difficulty walking, Decreased strength, Hypomobility, Impaired flexibility, Impaired perceived functional ability, Increased muscle spasms, Postural dysfunction, Pain, Improper body  mechanics  Visit Diagnosis: Radiculopathy, lumbar region  Chronic right-sided low back pain, with sciatica presence unspecified  Muscle weakness (generalized)     Problem List Patient Active Problem List   Diagnosis Date Noted  . Palpitation 08/27/2014  . PFO (patent foramen ovale) 08/27/2014   Precious Bard, PT, DPT   Precious Bard 02/20/2018, 5:07 PM  [facility] MAIN [facility] SERVICES [address] [place], [place], [address] Phone: [phone]   Fax:  [phone]  Name: Amber Roberson MRN: [id number] Date of Birth: [birth year]

## 2018-02-24 ENCOUNTER — Ambulatory Visit: Payer: Federal, State, Local not specified - PPO

## 2018-02-24 DIAGNOSIS — M545 Low back pain: Secondary | ICD-10-CM

## 2018-02-24 DIAGNOSIS — M5416 Radiculopathy, lumbar region: Secondary | ICD-10-CM

## 2018-02-24 DIAGNOSIS — M6281 Muscle weakness (generalized): Secondary | ICD-10-CM

## 2018-02-24 DIAGNOSIS — G8929 Other chronic pain: Secondary | ICD-10-CM

## 2018-02-24 NOTE — Therapy (Signed)
[facility] MAIN [facility] [address] [place], [place], [address] Phone: [phone]   Fax:  [phone]  Physical Therapy Treatment  Patient Details  Name: Amber Roberson MRN: [id number] Date of Birth: 1961-08-11 Referring Provider: Dr. Martha ClanKrasinski   Encounter Date: 02/24/2018  PT End of Session - 02/24/18 1751    Visit Number  2    Number of Visits  12    Date for PT Re-Evaluation  04/03/18    PT Start Time  1434    PT Stop Time  1515    PT Time Calculation (min)  41 min    Activity Tolerance  Patient limited by pain    Behavior During Therapy  Anxious;Agitated       Past Medical History:  Diagnosis Date  . Anemia   . Arrhythmia   . Neuropathy   . PFO (patent foramen ovale)   . Stroke [facility] 2009  . Syncope and collapse     Past Surgical History:  Procedure Laterality Date  . TONSILLECTOMY      There were no vitals filed for this visit.  Subjective Assessment - 02/24/18 1748    Subjective  Patient reports she is still having pain in bed, limiting ability to sleep. She has less pain with activity.     Pertinent History  Amber Roberson is a 57 y.o. female with history of stroke, PFO, central pain syndrome who presents with R low back pain and radiating symptoms. She states this pain has been going on for approximately two months since having a small fall. Most of her pain is axial on the right side but can radiate into the buttocks and sometimes down into the groin and the foot and leg. She had seen Dr. Martha ClanKrasinski who did not think her issue had to do with her hip. She also had an MRI of the lumbar spine showing mild multilevel degenerative changes of the lumbar spine with no significant spinal canal or foraminal stenosis seen. She had not done physical therapy yet, but she did have right L4-5 and L5-S1 facet injections and right SI joint injection, which she states did not help and actually was slightly painful. She states she  has difficulty sleeping at night and the pain is worse with standing long periods of time. 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 The patient denies any bowel or bladder incontinence, shortness of breath, chest pain, nausea, vomiting, diarrhea, fever or chills. 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  painful to lean back    How long can you stand comfortably?  painful    How long can you walk comfortably?  non painful    Diagnostic tests  MRI    Patient Stated Goals  to reduce pain to allow patient to sleep again    Currently in Pain?  Yes    Pain Score  5     Pain Location  Back    Pain Orientation  Lower;Right    Pain Descriptors / Indicators  Aching;Shooting    Pain Type  Chronic pain;Neuropathic pain    Pain Onset  More than a month ago      Hamstring stretch, popliteal angle stretch; 2x60 seconds each leg LE rotation stretch painful: decreased pain when utilized swiss ball 60 seconds  Neurologic glides R and L LE 20x SLR with leg on PT shoulder   SAD RLE 6x15 seconds inferior LAD RLE inferior 5x15 seconds    TrA activation swiss ball 10x  TrA activation 10x  TrA activation with marches   Grade I-II CPAs and UPAs lumbosacral region. R UPA L4-5 L5-S1 reproduce pain,   Prone hip flexor stretch bilaterally 2x30 seconds     No data recorded               PT Education - 02/24/18 1749    Education provided  Yes    Education Details  HEP, TA contractions, hamstring stretching    Person(s) Educated  Patient    Methods  Explanation;Demonstration;Verbal cues;Handout    Comprehension  Verbalized understanding;Returned demonstration;Need further instruction       PT Short Term Goals - 02/20/18 1659      PT SHORT TERM GOAL #1   Title  Patient  will be independent in home exercise program to improve strength/mobility for better functional independence with ADLs.    Baseline  HEP to be given    Time  2    Period  Weeks    Status  New    Target Date  03/06/18      PT SHORT TERM GOAL #2   Title   Patient (< 48 years old) will complete five times sit to stand test in < 10 seconds indicating an increased LE strength and improved balance.    Baseline  3/21: 12 seconds    Time  2    Period  Weeks    Status  New    Target Date  03/06/18      PT SHORT TERM GOAL #3   Title  Patient will report a worst pain of 5/10 on VAS in R lumbosacral region  to improve tolerance with ADLs and reduced symptoms with activities.     Baseline  3/21: 7/10    Time  2    Period  Weeks    Status  New    Target Date  03/06/18      PT SHORT TERM GOAL #4   Title  Patient will increase lumbar extension strength to at least 3+/5 as to improve gross strength for sitting/standing tolerance with better erect posture for increased tolerance with ADLs.     Baseline  2+/5     Time  2    Period  Weeks    Status  New    Target Date  03/06/18        PT Long Term Goals - 02/20/18 1701      PT LONG TERM GOAL #1   Title  Patient will report a worst pain of 3/10 on VAS in R lumbosacral region  to improve tolerance with ADLs and reduced symptoms with activities.     Baseline  3/21: 7/10    Time  6    Period  Weeks    Status  New    Target Date  04/03/18      PT LONG TERM GOAL #2   Title  Patient will tolerate sitting unsupported demonstrating erect sitting posture with minimal weight shift for 15+ minutes with maximum of 4/10 back pain to demonstrate improved back extensor strength and improved sitting tolerance.     Baseline  unable to tolerate    Time  6    Period  Weeks    Status  New    Target Date  04/03/18  PT LONG TERM GOAL #3   Title   Patient will reduce modified Oswestry score to <20 as to demonstrate minimal disability with ADLs including  improved sleeping tolerance, walking/sitting tolerance etc for better mobility with ADLs.     Baseline  3/21: 34%    Time  6    Period  Weeks    Status  New    Target Date  04/03/18      PT LONG TERM GOAL #4   Title  Patient will demonstrate 4+/5 strength of lumbar and RLE to improve gross strength for sitting/standing tolerance with better erect posture for increased tolerance with ADLs.     Baseline  2+/5 lumbar, 4-/5 RLE    Time  6    Period  Weeks    Status  New    Target Date  02/20/18            Plan - 02/24/18 1752    Clinical Impression Statement  Patient given TrA activation for HEP and was initially challenged by activation requiring tactile and verbal cueing with combined breathing for proper technique and muscle contraction. Patient has noted pain centric mindset and requires task orientation cueing throughout session. Patient will continue to benefit from skilled physical therapy to reduce pain and radiating symptoms for improved quality of life.     Rehab Potential  Fair    Clinical Impairments Affecting Rehab Potential  (+) high education in medical background, motivated (-) history of short term memory deficit, chronicity of pain     PT Frequency  2x / week    PT Duration  6 weeks    PT Treatment/Interventions  ADLs/Self Care Home Management;Aquatic Therapy;Electrical Stimulation;Iontophoresis 4mg /ml Dexamethasone;Moist Heat;Traction;Ultrasound;Fluidtherapy;Therapeutic activities;Functional mobility training;Stair training;Gait training;Therapeutic exercise;Balance training;Neuromuscular re-education;Patient/family education;Manual techniques;Passive range of motion;Dry needling;Energy conservation;Taping    PT Next Visit Plan  mobilize spine, neural glides, LE strength and core strength, give HEP    Consulted and Agree with Plan of Care  Patient       Patient will benefit from skilled therapeutic intervention in order to improve the following deficits and  impairments:  Abnormal gait, Decreased activity tolerance, Decreased endurance, Decreased mobility, Decreased range of motion, Difficulty walking, Decreased strength, Hypomobility, Impaired flexibility, Impaired perceived functional ability, Increased muscle spasms, Postural dysfunction, Pain, Improper body mechanics  Visit Diagnosis: Radiculopathy, lumbar region  Chronic right-sided low back pain, with sciatica presence unspecified  Muscle weakness (generalized)     Problem List Patient Active Problem List   Diagnosis Date Noted  . Palpitation 08/27/2014  . PFO (patent foramen ovale) 08/27/2014   Precious Bard, PT, DPT   Precious Bard 02/24/2018, 5:54 PM  [facility] MAIN [facility] SERVICES [address] [place], [place], [address] Phone: [phone]   Fax:  [phone]  Name: Ajooni Karam MRN: [id number] Date of Birth: [birth year]

## 2018-02-26 ENCOUNTER — Encounter: Payer: Self-pay | Admitting: Physical Therapy

## 2018-02-26 ENCOUNTER — Ambulatory Visit: Payer: Federal, State, Local not specified - PPO | Admitting: Physical Therapy

## 2018-02-26 DIAGNOSIS — M545 Low back pain: Secondary | ICD-10-CM

## 2018-02-26 DIAGNOSIS — M5416 Radiculopathy, lumbar region: Secondary | ICD-10-CM

## 2018-02-26 DIAGNOSIS — M6281 Muscle weakness (generalized): Secondary | ICD-10-CM

## 2018-02-26 DIAGNOSIS — G8929 Other chronic pain: Secondary | ICD-10-CM

## 2018-02-26 NOTE — Therapy (Signed)
[place] [facility] MAIN [facility] SERVICES [address] [place], [place], [address] Phone: [phone]   Fax:  [phone]  Physical Therapy Treatment  Patient Details  Name: Amber Roberson MRN: [id number] Date of Birth: 02-09-1961 Referring Provider: Dr. Martha Clan   Encounter Date: 02/26/2018  PT End of Session - 02/26/18 1353    Visit Number  3    Number of Visits  12    Date for PT Re-Evaluation  04/03/18    PT Start Time  1351    PT Stop Time  1432    PT Time Calculation (min)  41 min    Activity Tolerance  Patient limited by pain    Behavior During Therapy  Anxious;Agitated       Past Medical History:  Diagnosis Date  . Anemia   . Arrhythmia   . Neuropathy   . PFO (patent foramen ovale)   . Stroke [facility]) 2009  . Syncope and collapse     Past Surgical History:  Procedure Laterality Date  . TONSILLECTOMY      There were no vitals filed for this visit.  Subjective Assessment - 02/26/18 1356    Subjective  Pt reports she completed her HEP but she has had severe pain when goin got bed and is unable to find a comfortable position.  Pt had to take a pain medication pill last night before bed.  Pt ambulated ~3 miles today without much pain but when she lays down she has pain.     Pertinent History  Amber Roberson is a 57 y.o. female with history of stroke, PFO, central pain syndrome who presents with R low back pain and radiating symptoms. She states this pain has been going on for approximately two months since having a small fall. Most of her pain is axial on the right side but can radiate into the buttocks and sometimes down into the groin and the foot and leg. She had seen Dr. Martha Clan who did not think her issue had to do with her hip. She also had an MRI of the lumbar spine showing mild multilevel degenerative changes of the lumbar spine with no significant spinal canal or foraminal stenosis seen. She had not done physical therapy yet, but  she did have right L4-5 and L5-S1 facet injections and right SI joint injection, which she states did not help and actually was slightly painful. She states she has difficulty sleeping at night and the pain is worse with standing long periods of time. 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 The patient denies any bowel or bladder incontinence, shortness of breath, chest pain, nausea, vomiting, diarrhea, fever or chills. 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  painful to lean back    How long can you stand comfortably?  painful    How long can you walk comfortably?  non painful    Diagnostic tests  MRI    Patient Stated Goals  to reduce pain to allow patient to sleep again    Currently in Pain?  Yes    Pain Score  4     Pain Location  Back    Pain Orientation  Right    Pain Descriptors / Indicators  Aching    Pain Type  Chronic pain  Pain Onset  More than a month ago       TREATMENT   Hamstring stretch, 2x60 seconds each leg   LE rotation stretch painful: decreased pain when utilized swiss ball?60 seconds   Neurologic glides R and L LE 20x SLR with leg on PT shoulder   SAD RLE 4x15 seconds inferior   LAD RLE inferior 5x15 seconds   TrA activation 10x with cues for proper breathing technique so pt does not perform valsalva maneuver. Pt initially just breathing but once cues provided to try to flatten back against mat table the pt activated the TrA properly.   TrA activation with marches without allowing LEs to touch to mat table x10 each LE   Grade I-II CPAs and UPAs lumbosacral region. R UPA L4-5 L5-S1 reproduce pain   Prone hip flexor stretch bilaterally 2x30 seconds   Forward prayer stretch onto theraball x5 with 20 second holds. Pt denies pain and reports a  stretching sensation.                PT Education - 02/26/18 1353    Education provided  Yes    Education Details  Exercise technique; reasoning behind interventions; instructed pt in pillow placement for all positions of sleeping but pt says, "it's impossible for me to try this because I move 1,000 times in the night"    Person(s) Educated  Patient    Methods  Explanation;Demonstration;Verbal cues    Comprehension  Verbalized understanding;Returned demonstration;Verbal cues required;Need further instruction       PT Short Term Goals - 02/20/18 1659      PT SHORT TERM GOAL #1   Title  Patient will be independent in home exercise program to improve strength/mobility for better functional independence with ADLs.    Baseline  HEP to be given    Time  2    Period  Weeks    Status  New    Target Date  03/06/18      PT SHORT TERM GOAL #2   Title   Patient (< 56 years old) will complete five times sit to stand test in < 10 seconds indicating an increased LE strength and improved balance.    Baseline  3/21: 12 seconds    Time  2    Period  Weeks    Status  New    Target Date  03/06/18      PT SHORT TERM GOAL #3   Title  Patient will report a worst pain of 5/10 on VAS in R lumbosacral region  to improve tolerance with ADLs and reduced symptoms with activities.     Baseline  3/21: 7/10    Time  2    Period  Weeks    Status  New    Target Date  03/06/18      PT SHORT TERM GOAL #4   Title  Patient will increase lumbar extension strength to at least 3+/5 as to improve gross strength for sitting/standing tolerance with better erect posture for increased tolerance with ADLs.     Baseline  2+/5     Time  2    Period  Weeks    Status  New    Target Date  03/06/18        PT Long Term Goals - 02/20/18 1701      PT LONG TERM GOAL #1   Title  Patient will report a worst pain of 3/10 on VAS in R lumbosacral region  to  improve tolerance with ADLs and reduced symptoms with  activities.     Baseline  3/21: 7/10    Time  6    Period  Weeks    Status  New    Target Date  04/03/18      PT LONG TERM GOAL #2   Title  Patient will tolerate sitting unsupported demonstrating erect sitting posture with minimal weight shift for 15+ minutes with maximum of 4/10 back pain to demonstrate improved back extensor strength and improved sitting tolerance.     Baseline  unable to tolerate    Time  6    Period  Weeks    Status  New    Target Date  04/03/18      PT LONG TERM GOAL #3   Title   Patient will reduce modified Oswestry score to <20 as to demonstrate minimal disability with ADLs including improved sleeping tolerance, walking/sitting tolerance etc for better mobility with ADLs.     Baseline  3/21: 34%    Time  6    Period  Weeks    Status  New    Target Date  04/03/18      PT LONG TERM GOAL #4   Title  Patient will demonstrate 4+/5 strength of lumbar and RLE to improve gross strength for sitting/standing tolerance with better erect posture for increased tolerance with ADLs.     Baseline  2+/5 lumbar, 4-/5 RLE    Time  6    Period  Weeks    Status  New    Target Date  02/20/18            Plan - 02/26/18 1359    Clinical Impression Statement  Pt reports significant pain when she lies down regardless of the position and had to take medication because of this.  Recommendations made for pillow positioning in each position in sleeping but pt says "it's impossible for me to try this because I move 1,000 times in the night".  Pt has difficulty contracting TrA correctly with her initially simply inhaling and exhaling.  She was able to achieve proper activation after cues to press back flat against mat table.  Introduced prayer stretch this session with hopes of decreasing lower back muscle spasms.  Pt will benefit from continued skilled PT interventions for decreased pain and improved QOL.     Rehab Potential  Fair    Clinical Impairments Affecting Rehab Potential   (+) high education in medical background, motivated (-) history of short term memory deficit, chronicity of pain     PT Frequency  2x / week    PT Duration  6 weeks    PT Treatment/Interventions  ADLs/Self Care Home Management;Aquatic Therapy;Electrical Stimulation;Iontophoresis 4mg /ml Dexamethasone;Moist Heat;Traction;Ultrasound;Fluidtherapy;Therapeutic activities;Functional mobility training;Stair training;Gait training;Therapeutic exercise;Balance training;Neuromuscular re-education;Patient/family education;Manual techniques;Passive range of motion;Dry needling;Energy conservation;Taping    PT Next Visit Plan  mobilize spine, neural glides, LE strength and core strength, give HEP    Consulted and Agree with Plan of Care  Patient       Patient will benefit from skilled therapeutic intervention in order to improve the following deficits and impairments:  Abnormal gait, Decreased activity tolerance, Decreased endurance, Decreased mobility, Decreased range of motion, Difficulty walking, Decreased strength, Hypomobility, Impaired flexibility, Impaired perceived functional ability, Increased muscle spasms, Postural dysfunction, Pain, Improper body mechanics  Visit Diagnosis: Radiculopathy, lumbar region  Chronic right-sided low back pain, with sciatica presence unspecified  Muscle weakness (generalized)     Problem List Patient  Active Problem List   Diagnosis Date Noted  . Palpitation 08/27/2014  . PFO (patent foramen ovale) 08/27/2014     Encarnacion Chu PT, DPT 02/26/2018, 2:30 PM  [place] MAIN [facility] SERVICES [address] [place], [place], [address] Phone: [phone]   Fax:  [phone]  Name: Amber Roberson MRN: [id number] Date of Birth: 01/19/1961

## 2018-03-03 ENCOUNTER — Ambulatory Visit: Payer: Federal, State, Local not specified - PPO | Attending: Orthopedic Surgery

## 2018-03-03 DIAGNOSIS — M5416 Radiculopathy, lumbar region: Secondary | ICD-10-CM

## 2018-03-03 DIAGNOSIS — M6281 Muscle weakness (generalized): Secondary | ICD-10-CM | POA: Insufficient documentation

## 2018-03-03 DIAGNOSIS — G8929 Other chronic pain: Secondary | ICD-10-CM | POA: Diagnosis present

## 2018-03-03 DIAGNOSIS — M545 Low back pain: Secondary | ICD-10-CM | POA: Insufficient documentation

## 2018-03-03 NOTE — Therapy (Signed)
[facility] MAIN [facility] SERVICES [address] [place], [place], [address] Phone: [phone]   Fax:  [phone]  Physical Therapy Treatment  Patient Details  Name: Amber Roberson MRN: [id number] Date of Birth: 12/19/1960 Referring Provider: Dr. Martha Clan   Encounter Date: 03/03/2018  PT End of Session - 03/03/18 1110    Visit Number  4    Number of Visits  12    Date for PT Re-Evaluation  04/03/18    PT Start Time  0937    PT Stop Time  1015    PT Time Calculation (min)  38 min    Activity Tolerance  Patient limited by pain    Behavior During Therapy  Anxious;Agitated       Past Medical History:  Diagnosis Date  . Anemia   . Arrhythmia   . Neuropathy   . PFO (patent foramen ovale)   . Stroke [facility]) 2009  . Syncope and collapse     Past Surgical History:  Procedure Laterality Date  . TONSILLECTOMY      There were no vitals filed for this visit.  Subjective Assessment - 03/03/18 0940    Subjective  Patient has not done her HEP over the weekend due to it being busy. Having cramping in hamstrings when ambulating.     Pertinent History  Amber Roberson is a 57 y.o. female with history of stroke, PFO, central pain syndrome who presents with R low back pain and radiating symptoms. She states this pain has been going on for approximately two months since having a small fall. Most of her pain is axial on the right side but can radiate into the buttocks and sometimes down into the groin and the foot and leg. She had seen Dr. Martha Clan who did not think her issue had to do with her hip. She also had an MRI of the lumbar spine showing mild multilevel degenerative changes of the lumbar spine with no significant spinal canal or foraminal stenosis seen. She had not done physical therapy yet, but she did have right L4-5 and L5-S1 facet injections and right SI joint injection, which she states did not help and actually was slightly painful. She states  she has difficulty sleeping at night and the pain is worse with standing long periods of time. 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 The patient denies any bowel or bladder incontinence, shortness of breath, chest pain, nausea, vomiting, diarrhea, fever or chills. 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  painful to lean back    How long can you stand comfortably?  painful    How long can you walk comfortably?  non painful    Diagnostic tests  MRI    Patient Stated Goals  to reduce pain to allow patient to sleep again    Currently in Pain?  Yes    Pain Score  2     Pain Location  Back    Pain Orientation  Right    Pain Descriptors / Indicators  Aching    Pain Type  Chronic pain    Pain Onset  More than a month ago    Pain Frequency  Constant          Hamstring stretch, 2x60 seconds each leg    LE  rotation stretch painful: decreased pain when utilized swiss ball?60 seconds    Neurologic glides R and L LE 20x SLR with leg on PT shoulder    SAD RLE 4x15 seconds inferior    LAD RLE inferior 5x15 seconds    TrA activation 10x with cues for proper breathing technique so pt does not perform valsalva maneuver. Pt initially just breathing but once cues provided to try to flatten back against mat table the pt activated the TrA properly.    TrA activation with marches without allowing LEs to touch to mat table x10 each LE    Grade I-II CPAs and UPAs lumbosacral region. R UPA L4-5 L5-S1 reproduce pain  Inferior glide on sacrum: left, right, central.    Prone hip flexor stretch bilaterally 2x30 seconds   STM to R parapsinals with heat pack on R piriformis 8 minutes      No data recorded               PT Education - 03/03/18 1109    Education provided   Yes    Education Details  exercise technique, TrA activation, manual     Person(s) Educated  Patient    Methods  Explanation;Demonstration;Verbal cues    Comprehension  Verbalized understanding;Returned demonstration       PT Short Term Goals - 02/20/18 1659      PT SHORT TERM GOAL #1   Title  Patient will be independent in home exercise program to improve strength/mobility for better functional independence with ADLs.    Baseline  HEP to be given    Time  2    Period  Weeks    Status  New    Target Date  03/06/18      PT SHORT TERM GOAL #2   Title   Patient (< 57 years old) will complete five times sit to stand test in < 10 seconds indicating an increased LE strength and improved balance.    Baseline  3/21: 12 seconds    Time  2    Period  Weeks    Status  New    Target Date  03/06/18      PT SHORT TERM GOAL #3   Title  Patient will report a worst pain of 5/10 on VAS in R lumbosacral region  to improve tolerance with ADLs and reduced symptoms with activities.     Baseline  3/21: 7/10    Time  2    Period  Weeks    Status  New    Target Date  03/06/18      PT SHORT TERM GOAL #4   Title  Patient will increase lumbar extension strength to at least 3+/5 as to improve gross strength for sitting/standing tolerance with better erect posture for increased tolerance with ADLs.     Baseline  2+/5     Time  2    Period  Weeks    Status  New    Target Date  03/06/18        PT Long Term Goals - 02/20/18 1701      PT LONG TERM GOAL #1   Title  Patient will report a worst pain of 3/10 on VAS in R lumbosacral region  to improve tolerance with ADLs and reduced symptoms with activities.     Baseline  3/21: 7/10    Time  6    Period  Weeks    Status  New    Target Date  04/03/18      PT LONG TERM GOAL #2   Title  Patient will tolerate sitting unsupported demonstrating erect sitting posture with minimal weight shift for 15+ minutes with maximum of 4/10 back pain to demonstrate  improved back extensor strength and improved sitting tolerance.     Baseline  unable to tolerate    Time  6    Period  Weeks    Status  New    Target Date  04/03/18      PT LONG TERM GOAL #3   Title   Patient will reduce modified Oswestry score to <20 as to demonstrate minimal disability with ADLs including improved sleeping tolerance, walking/sitting tolerance etc for better mobility with ADLs.     Baseline  3/21: 34%    Time  6    Period  Weeks    Status  New    Target Date  04/03/18      PT LONG TERM GOAL #4   Title  Patient will demonstrate 4+/5 strength of lumbar and RLE to improve gross strength for sitting/standing tolerance with better erect posture for increased tolerance with ADLs.     Baseline  2+/5 lumbar, 4-/5 RLE    Time  6    Period  Weeks    Status  New    Target Date  02/20/18            Plan - 03/03/18 1112    Clinical Impression Statement  Patient presents with occasional spasming when performing hamstring stretch and muscle activation. Utilization of heat and soft tissue massage reduced spasaming. Patient tender to mobilization of spine and sacrum. Patient will continue to benefit from skilled physical therapy to decrease pain and improve quality of life.     Rehab Potential  Fair    Clinical Impairments Affecting Rehab Potential  (+) high education in medical background, motivated (-) history of short term memory deficit, chronicity of pain     PT Frequency  2x / week    PT Duration  6 weeks    PT Treatment/Interventions  ADLs/Self Care Home Management;Aquatic Therapy;Electrical Stimulation;Iontophoresis 4mg /ml Dexamethasone;Moist Heat;Traction;Ultrasound;Fluidtherapy;Therapeutic activities;Functional mobility training;Stair training;Gait training;Therapeutic exercise;Balance training;Neuromuscular re-education;Patient/family education;Manual techniques;Passive range of motion;Dry needling;Energy conservation;Taping    PT Next Visit Plan  mobilize spine,  neural glides, LE strength and core strength, give HEP    Consulted and Agree with Plan of Care  Patient       Patient will benefit from skilled therapeutic intervention in order to improve the following deficits and impairments:  Abnormal gait, Decreased activity tolerance, Decreased endurance, Decreased mobility, Decreased range of motion, Difficulty walking, Decreased strength, Hypomobility, Impaired flexibility, Impaired perceived functional ability, Increased muscle spasms, Postural dysfunction, Pain, Improper body mechanics  Visit Diagnosis: Radiculopathy, lumbar region  Chronic right-sided low back pain, with sciatica presence unspecified  Muscle weakness (generalized)     Problem List Patient Active Problem List   Diagnosis Date Noted  . Palpitation 08/27/2014  . PFO (patent foramen ovale) 08/27/2014   Precious Bard, PT, DPT   Precious Bard 03/03/2018, 11:14 AM   [facility] MAIN [facility] SERVICES [address] [place], [place], [address] Phone: [phone]   Fax:  [phone]  Name: Amber Roberson MRN: [id number] Date of Birth: 07/29/1961

## 2018-03-07 ENCOUNTER — Ambulatory Visit: Payer: Federal, State, Local not specified - PPO

## 2018-03-07 DIAGNOSIS — M6281 Muscle weakness (generalized): Secondary | ICD-10-CM

## 2018-03-07 DIAGNOSIS — M5416 Radiculopathy, lumbar region: Secondary | ICD-10-CM

## 2018-03-07 DIAGNOSIS — M545 Low back pain: Secondary | ICD-10-CM

## 2018-03-07 DIAGNOSIS — G8929 Other chronic pain: Secondary | ICD-10-CM

## 2018-03-07 NOTE — Therapy (Signed)
[place] [facility] [address] [place], [place], [address] Phone: [phone]   Fax:  [phone]  Physical Therapy Treatment  Patient Details  Name: Amber Roberson MRN: [id number] Date of Birth: [birth year] Referring Provider: Dr. Martha ClanKrasinski   Encounter Date: 03/07/2018  PT End of Session - 03/07/18 1114    Visit Number  5    Number of Visits  12    Date for PT Re-Evaluation  04/03/18    PT Start Time  1020    PT Stop Time  1106    PT Time Calculation (min)  46 min    Activity Tolerance  Patient limited by pain    Behavior During Therapy  Anxious;Agitated       Past Medical History:  Diagnosis Date  . Anemia   . Arrhythmia   . Neuropathy   . PFO (patent foramen ovale)   . Stroke [facility]) 2009  . Syncope and collapse     Past Surgical History:  Procedure Laterality Date  . TONSILLECTOMY      There were no vitals filed for this visit.  Subjective Assessment - 03/07/18 1023    Subjective  Patient reports pain is continuing to disrupt sleep but not painful in morning.     Pertinent History  Amber Roberson is a 57 y.o. female with history of stroke, PFO, central pain syndrome who presents with R low back pain and radiating symptoms. She states this pain has been going on for approximately two months since having a small fall. Most of her pain is axial on the right side but can radiate into the buttocks and sometimes down into the groin and the foot and leg. She had seen Dr. Martha ClanKrasinski who did not think her issue had to do with her hip. She also had an MRI of the lumbar spine showing mild multilevel degenerative changes of the lumbar spine with no significant spinal canal or foraminal stenosis seen. She had not done physical therapy yet, but she did have right L4-5 and L5-S1 facet injections and right SI joint injection, which she states did not help and actually was slightly painful. She states she has difficulty sleeping at  night and the pain is worse with standing long periods of time. 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 The patient denies any bowel or bladder incontinence, shortness of breath, chest pain, nausea, vomiting, diarrhea, fever or chills. 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  painful to lean back    How long can you stand comfortably?  painful    How long can you walk comfortably?  non painful    Diagnostic tests  MRI    Patient Stated Goals  to reduce pain to allow patient to sleep again    Currently in Pain?  Yes    Pain Score  1     Pain Location  Back    Pain Orientation  Right;Lower    Pain Descriptors / Indicators  Aching    Pain Type  Chronic pain    Pain Onset  More than a month ago    Pain Frequency  Constant       Supine:   PROM Hamstring stretch, 2x60 seconds each leg : L tighter than R    LE rotation  stretch: windshield wipers slow with PT guiding   Neurologic glides R and L LE 20x SLR with leg on PT shoulder    SAD RLE 4x15 seconds inferior    LAD RLE inferior 5x15 seconds    TrA with swiss ball press 10x : cues for keeping feet on ground. (add to HEP)    TrA activation with marches without allowing LEs to touch to mat table x10 each LE ; challenged with TrA activation.   Sidebend lateral crunches (penguins) 10x (add to HEP)  Hamstring stretch with sheet (added to pt. HEP) 2x30 seconds  Seated:  Hamstring stretch 1x20 seconds  Standing: Hamstring stair stretch 1x30 seconds; cues for keeping toes df and back straight (add to HEP)   Prone:   Grade I-II CPAs and UPAs lumbosacral region. R UPA L4-5 L5-S1 reproduce pain   Inferior glide on sacrum: left, right, central. 2 minutes   Prone hip flexor stretch bilaterally 2x30 seconds                          PT Education - 03/07/18 1114    Education provided  Yes    Education Details  exercise technique: HEP : hamstring stretch (stair and sheet), Swiss ball TrA activation and sidebends.     Person(s) Educated  Patient    Methods  Explanation;Demonstration    Comprehension  Verbalized understanding;Returned demonstration;Need further instruction       PT Short Term Goals - 02/20/18 1659      PT SHORT TERM GOAL #1   Title  Patient will be independent in home exercise program to improve strength/mobility for better functional independence with ADLs.    Baseline  HEP to be given    Time  2    Period  Weeks    Status  New    Target Date  03/06/18      PT SHORT TERM GOAL #2   Title   Patient (< 51 years old) will complete five times sit to stand test in < 10 seconds indicating an increased LE strength and improved balance.    Baseline  3/21: 12 seconds    Time  2    Period  Weeks    Status  New    Target Date  03/06/18      PT SHORT TERM GOAL #3   Title  Patient will report a worst pain of 5/10 on VAS in R lumbosacral region  to improve tolerance with ADLs and reduced symptoms with activities.     Baseline  3/21: 7/10    Time  2    Period  Weeks    Status  New    Target Date  03/06/18      PT SHORT TERM GOAL #4   Title  Patient will increase lumbar extension strength to at least 3+/5 as to improve gross strength for sitting/standing tolerance with better erect posture for increased tolerance with ADLs.     Baseline  2+/5     Time  2    Period  Weeks    Status  New    Target Date  03/06/18        PT Long Term Goals - 02/20/18 1701      PT LONG TERM GOAL #1   Title  Patient will report a worst pain of 3/10 on VAS in R lumbosacral region  to improve tolerance with ADLs and reduced symptoms with activities.  Baseline  3/21: 7/10    Time  6    Period  Weeks    Status  New    Target Date  04/03/18      PT LONG TERM GOAL #2    Title  Patient will tolerate sitting unsupported demonstrating erect sitting posture with minimal weight shift for 15+ minutes with maximum of 4/10 back pain to demonstrate improved back extensor strength and improved sitting tolerance.     Baseline  unable to tolerate    Time  6    Period  Weeks    Status  New    Target Date  04/03/18      PT LONG TERM GOAL #3   Title   Patient will reduce modified Oswestry score to <20 as to demonstrate minimal disability with ADLs including improved sleeping tolerance, walking/sitting tolerance etc for better mobility with ADLs.     Baseline  3/21: 34%    Time  6    Period  Weeks    Status  New    Target Date  04/03/18      PT LONG TERM GOAL #4   Title  Patient will demonstrate 4+/5 strength of lumbar and RLE to improve gross strength for sitting/standing tolerance with better erect posture for increased tolerance with ADLs.     Baseline  2+/5 lumbar, 4-/5 RLE    Time  6    Period  Weeks    Status  New    Target Date  02/20/18            Plan - 03/07/18 1115    Clinical Impression Statement  Patient had multiple spasms of L hamstring due to excessively tight musculature. Multiple position stretches of hamstring were performed with patient demonstrating understanding and need to add to HEP. Patient continues to be challenged by abdominal activation at this time. Distraction is pain relieving and will be utilized in future sessions. Patient will continue to benefit from skilled physical therapy to decrease pain and improve quality of life.     Rehab Potential  Fair    Clinical Impairments Affecting Rehab Potential  (+) high education in medical background, motivated (-) history of short term memory deficit, chronicity of pain     PT Frequency  2x / week    PT Duration  6 weeks    PT Treatment/Interventions  ADLs/Self Care Home Management;Aquatic Therapy;Electrical Stimulation;Iontophoresis 4mg /ml Dexamethasone;Moist  Heat;Traction;Ultrasound;Fluidtherapy;Therapeutic activities;Functional mobility training;Stair training;Gait training;Therapeutic exercise;Balance training;Neuromuscular re-education;Patient/family education;Manual techniques;Passive range of motion;Dry needling;Energy conservation;Taping    PT Next Visit Plan  try distraction machine    Consulted and Agree with Plan of Care  Patient       Patient will benefit from skilled therapeutic intervention in order to improve the following deficits and impairments:  Abnormal gait, Decreased activity tolerance, Decreased endurance, Decreased mobility, Decreased range of motion, Difficulty walking, Decreased strength, Hypomobility, Impaired flexibility, Impaired perceived functional ability, Increased muscle spasms, Postural dysfunction, Pain, Improper body mechanics  Visit Diagnosis: Radiculopathy, lumbar region  Chronic right-sided low back pain, with sciatica presence unspecified  Muscle weakness (generalized)     Problem List Patient Active Problem List   Diagnosis Date Noted  . Palpitation 08/27/2014  . PFO (patent foramen ovale) 08/27/2014   Precious Bard, PT, DPT   Precious Bard 03/07/2018, 11:17 AM  [place] [facility] MAIN [facility] SERVICES [address] [place], [place], [address] Phone: [phone]   Fax:  [phone]  Name: Amber Roberson MRN: [id number]  Date of Birth: 03-Apr-1961

## 2018-03-14 ENCOUNTER — Ambulatory Visit: Payer: Federal, State, Local not specified - PPO

## 2018-03-18 ENCOUNTER — Other Ambulatory Visit: Payer: Self-pay | Admitting: Neurology

## 2018-03-18 DIAGNOSIS — R1031 Right lower quadrant pain: Secondary | ICD-10-CM

## 2018-03-24 ENCOUNTER — Ambulatory Visit
Admission: RE | Admit: 2018-03-24 | Discharge: 2018-03-24 | Disposition: A | Discharge status: Home / Self Care | Payer: Federal, State, Local not specified - PPO | Source: Ambulatory Visit | Attending: Neurology | Admitting: Neurology

## 2018-03-24 DIAGNOSIS — M533 Sacrococcygeal disorders, not elsewhere classified: Secondary | ICD-10-CM | POA: Diagnosis not present

## 2018-03-24 DIAGNOSIS — G43719 Chronic migraine without aura, intractable, without status migrainosus: Secondary | ICD-10-CM | POA: Insufficient documentation

## 2018-03-24 DIAGNOSIS — M47816 Spondylosis without myelopathy or radiculopathy, lumbar region: Secondary | ICD-10-CM | POA: Diagnosis not present

## 2018-03-24 DIAGNOSIS — R1031 Right lower quadrant pain: Secondary | ICD-10-CM

## 2018-07-10 IMAGING — MR MR PELVIS W/O CM
4 of 5 series · 29 of 48 positions shown · non-contrast
Comparison: Lumbar MRI 12/04/2017.  Abdominal CT 11/16/2010.

CLINICAL DATA: 56-year-old with severe right inguinal pain since
falling 4 months ago.

EXAM:
MRI PELVIS WITHOUT CONTRAST
TECHNIQUE: Multiplanar multisequence MR imaging of the pelvis was performed. No
intravenous contrast was administered.

[Series 2: T1 · axial · 6.0mm · 1.56mm/px · z∈[-135,+125]mm · 8 of 30 slices shown (1 of 2)]
[im 1/30]
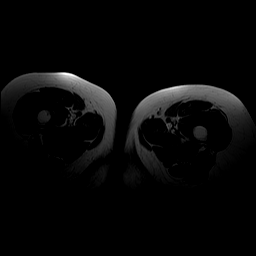
[im 5/30]
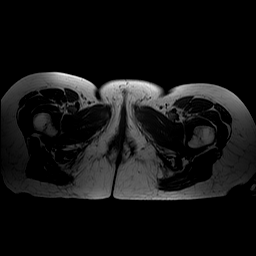
[im 9/30]
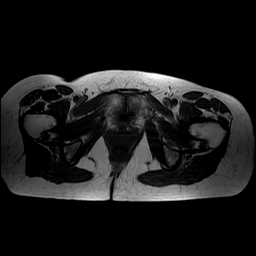
[im 13/30]
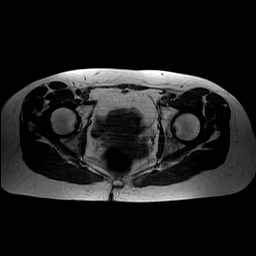
[im 17/30]
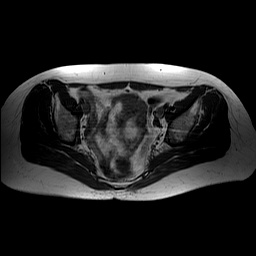
[im 21/30]
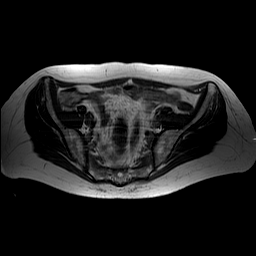
[im 25/30]
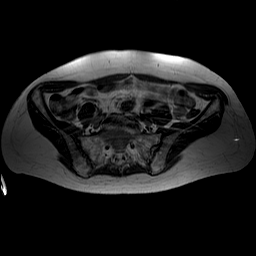
[im 30/30]
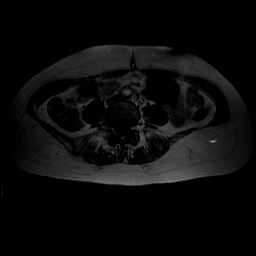

[Series 3: T2 fat-sat · axial · 6.0mm · 0.78mm/px · z∈[-135,+126]mm · 9 of 30 slices shown]
[im 1/30]
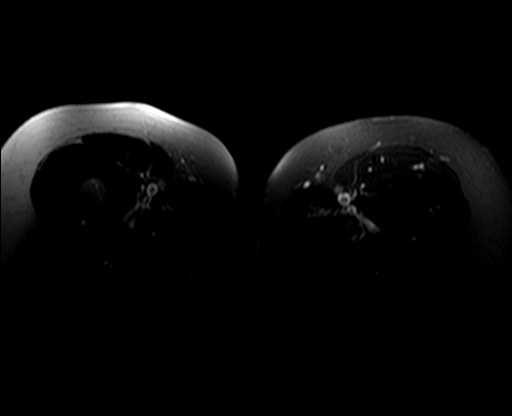
[im 4/30]
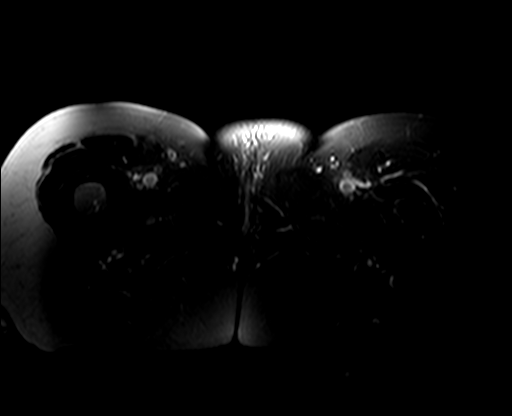
[im 8/30]
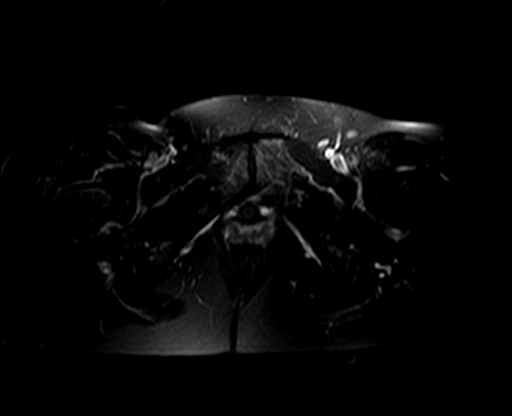
[im 11/30]
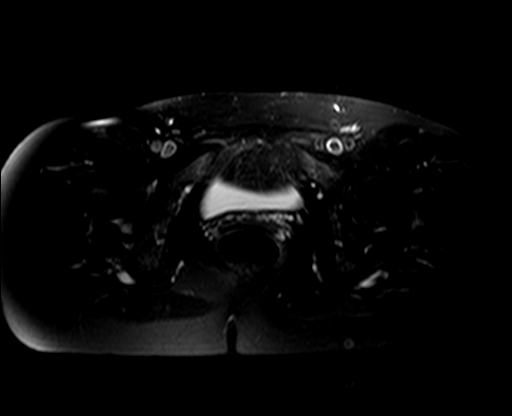
[im 15/30]
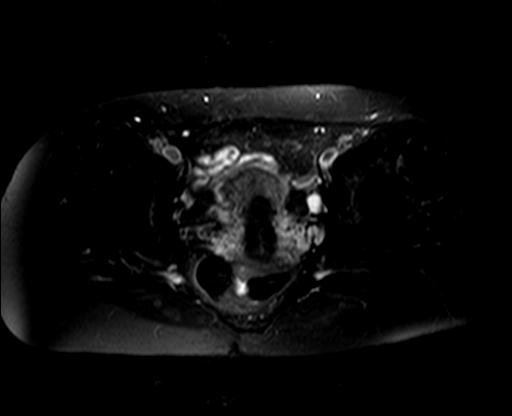
[im 19/30]
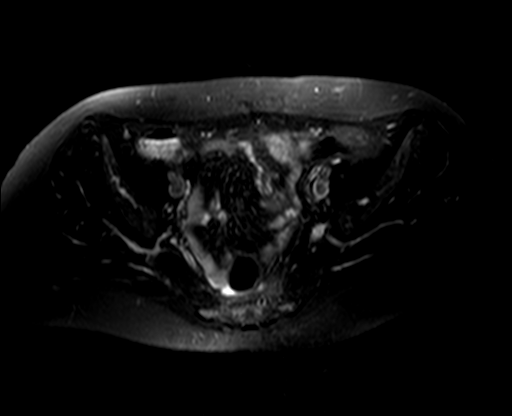
[im 22/30]
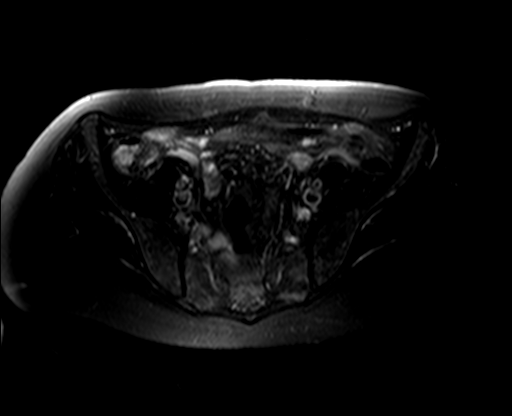
[im 26/30]
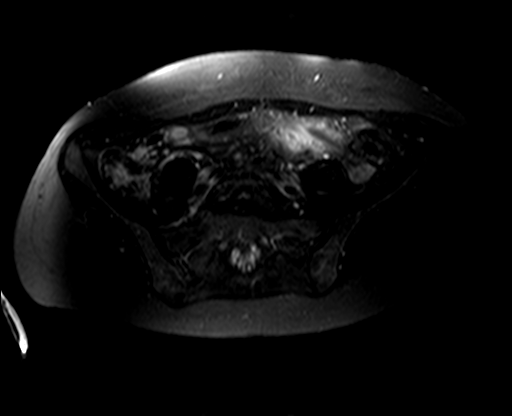
[im 30/30]
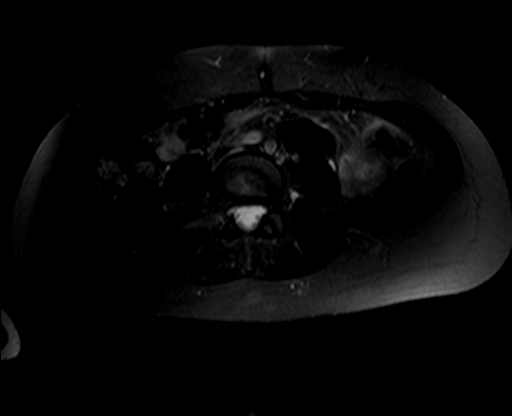

[Series 4: T1 · coronal · 5.0mm · 0.78mm/px · 9 of 30 slices shown (2 of 2)]
[im 1/30]
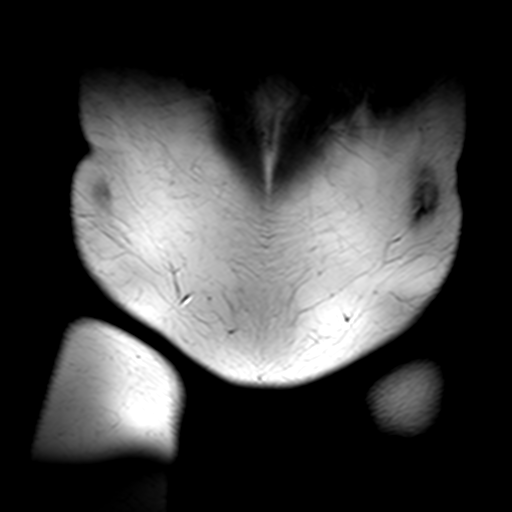
[im 4/30]
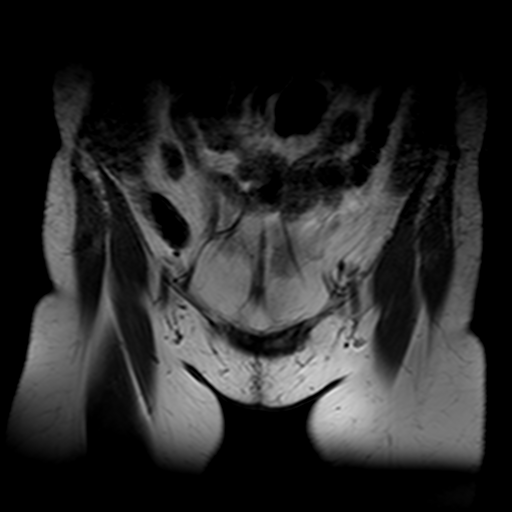
[im 8/30]
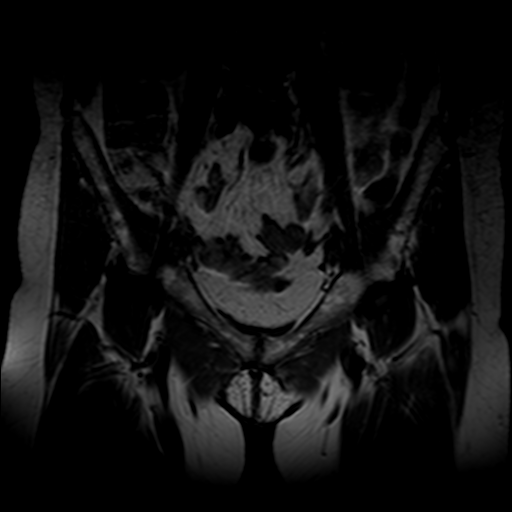
[im 11/30]
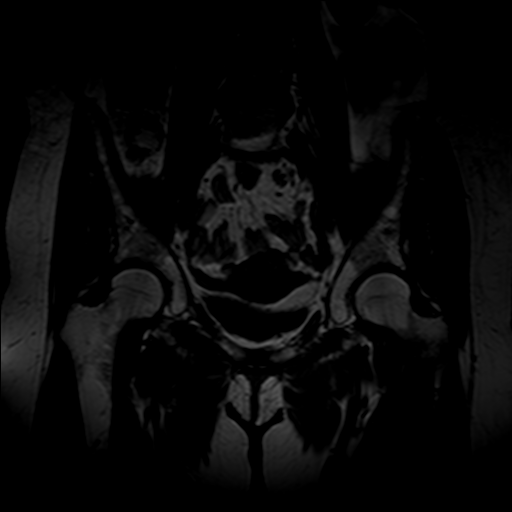
[im 15/30]
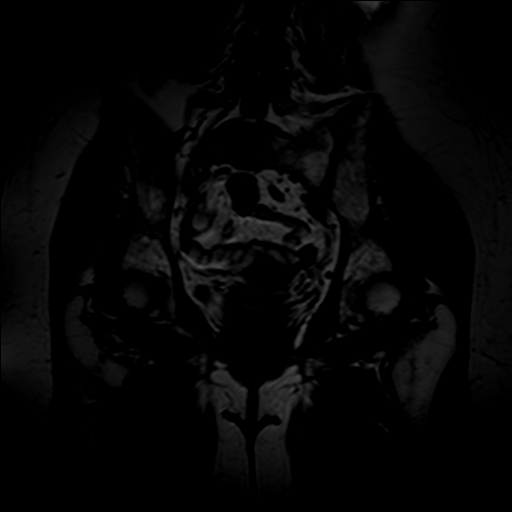
[im 19/30]
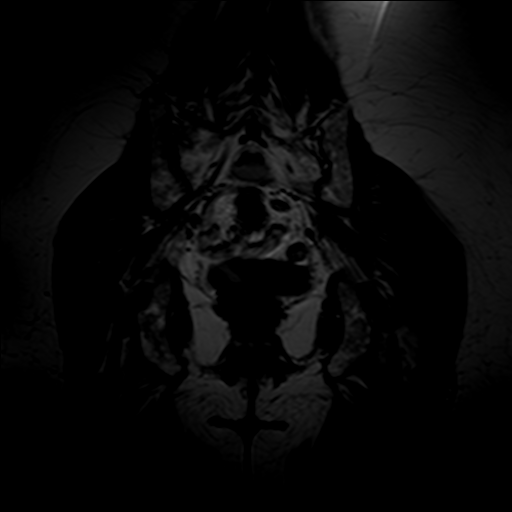
[im 22/30]
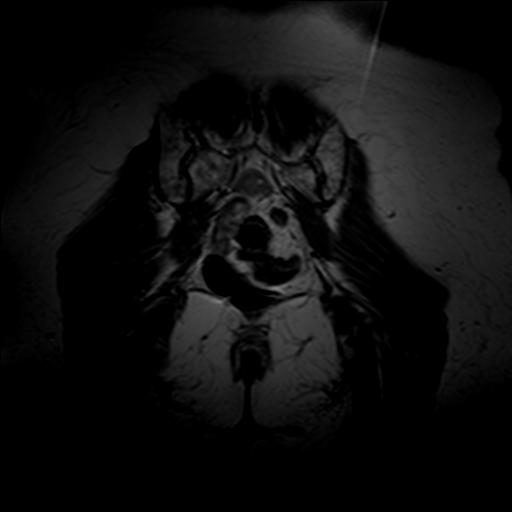
[im 26/30]
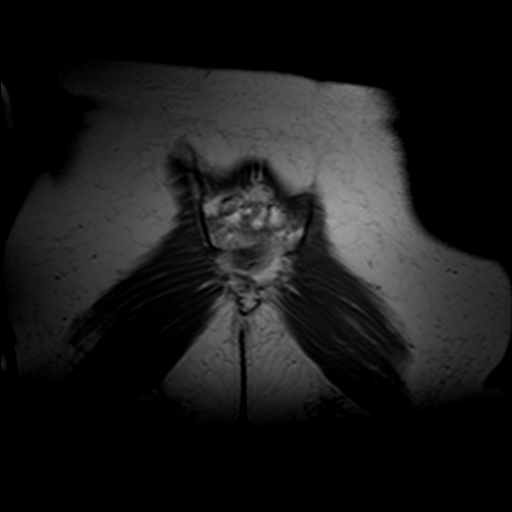
[im 30/30]
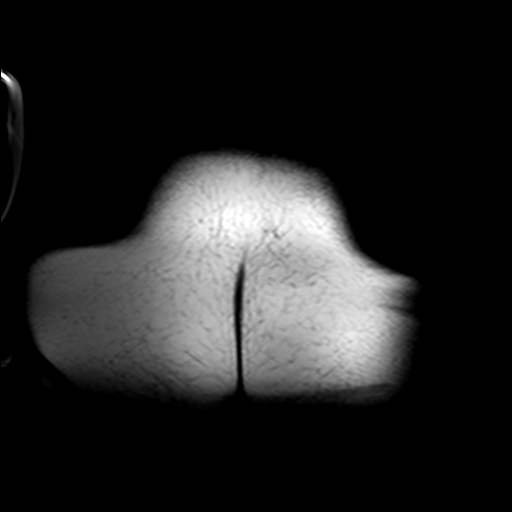

[Series 5: STIR · coronal · 5.0mm · 1.56mm/px · 3 of 30 slices shown]
[im 4/30]
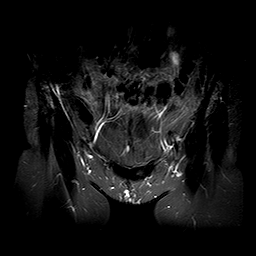
[im 15/30]
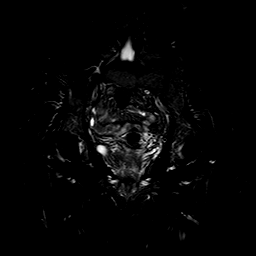
[im 26/30]
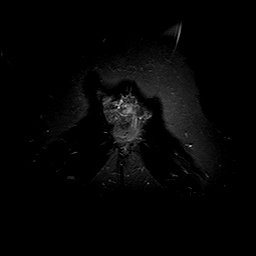

[29 of 48 positions shown; findings below may reference images not displayed]

FINDINGS: Urinary Tract: The visualized distal ureters and bladder appear
unremarkable.

Bowel: No bowel wall thickening, distention or surrounding
inflammation identified within the pelvis.

Vascular/Lymphatic: No enlarged pelvic lymph nodes identified. No
significant vascular findings.

Reproductive: The uterus and ovaries appear unremarkable. No adnexal
mass.

Other: Small amount of free pelvic fluid, within physiologic limits.
The visualized anterior abdominal wall and right groin appear
unremarkable.

Musculoskeletal: No acute or worrisome osseous findings. Mild lower
lumbar spondylosis again noted.
IMPRESSION: No acute findings or explanation for the patient's symptoms.

## 2019-01-23 ENCOUNTER — Other Ambulatory Visit: Payer: Self-pay | Admitting: Internal Medicine

## 2019-01-23 DIAGNOSIS — Z1231 Encounter for screening mammogram for malignant neoplasm of breast: Secondary | ICD-10-CM

## 2019-01-27 ENCOUNTER — Inpatient Hospital Stay: Admission: RE | Admit: 2019-01-27 | Payer: Federal, State, Local not specified - PPO | Source: Ambulatory Visit

## 2019-07-14 ENCOUNTER — Ambulatory Visit
Admission: RE | Admit: 2019-07-14 | Discharge: 2019-07-14 | Disposition: A | Discharge status: Home / Self Care | Payer: Federal, State, Local not specified - PPO | Source: Ambulatory Visit | Attending: Internal Medicine | Admitting: Internal Medicine

## 2019-07-14 DIAGNOSIS — Z1231 Encounter for screening mammogram for malignant neoplasm of breast: Secondary | ICD-10-CM

## 2020-08-13 ENCOUNTER — Other Ambulatory Visit: Payer: Self-pay

## 2020-08-13 ENCOUNTER — Emergency Department
Admission: EM | Admit: 2020-08-13 | Discharge: 2020-08-13 | Disposition: A | Discharge status: Home / Self Care | Payer: Federal, State, Local not specified - PPO | Attending: Emergency Medicine | Admitting: Emergency Medicine

## 2020-08-13 ENCOUNTER — Encounter: Payer: Self-pay | Admitting: Emergency Medicine

## 2020-08-13 DIAGNOSIS — Z79899 Other long term (current) drug therapy: Secondary | ICD-10-CM | POA: Insufficient documentation

## 2020-08-13 DIAGNOSIS — Z20822 Contact with and (suspected) exposure to covid-19: Secondary | ICD-10-CM | POA: Diagnosis present

## 2020-08-13 DIAGNOSIS — Z7982 Long term (current) use of aspirin: Secondary | ICD-10-CM | POA: Diagnosis not present

## 2020-08-13 LAB — SARS CORONAVIRUS 2 BY RT PCR (HOSPITAL ORDER, PERFORMED IN ~~LOC~~ HOSPITAL LAB): SARS Coronavirus 2: NEGATIVE

## 2020-08-13 NOTE — ED Triage Notes (Signed)
Pt arrived via POV with reports of needing COVID-19 testing for travel.

## 2020-08-13 NOTE — ED Provider Notes (Signed)
[facility] Emergency Department Provider Note  ____________________________________________  Time seen: Approximately 7:32 PM  I have reviewed the triage vital signs and the nursing notes.   HISTORY  Chief Complaint COVID TEST    HPI Amber Roberson is a 59 y.o. female who reports being in her usual state of health but needing immediate Covid testing to allow for international travel for family emergency.  No acute symptoms.      Past Medical History:  Diagnosis Date  . Anemia   . Arrhythmia   . Neuropathy   . PFO (patent foramen ovale)   . Stroke [facility]) 2009  . Syncope and collapse      Patient Active Problem List   Diagnosis Date Noted  . Palpitation 08/27/2014  . PFO (patent foramen ovale) 08/27/2014     Past Surgical History:  Procedure Laterality Date  . TONSILLECTOMY       Prior to Admission medications   Medication Sig Start Date End Date Taking? Authorizing Provider  aspirin 81 MG tablet Take 81 mg by mouth daily.    [provider]  atorvastatin (LIPITOR) 40 MG tablet Take 40 mg by mouth daily.    [provider]  clonazePAM (KLONOPIN) 0.5 MG tablet Take 0.5 mg by mouth at bedtime as needed for anxiety.    [provider]  ferrous sulfate 325 (65 FE) MG tablet Take 325 mg by mouth daily with breakfast.    [provider]  pregabalin (LYRICA) 150 MG capsule Take 150 mg by mouth 4 (four) times daily - after meals and at bedtime.    [provider]  zolpidem (AMBIEN) 5 MG tablet Take 2.5 mg by mouth at bedtime as needed for sleep.    [provider]     Allergies Other, Donepezil, and Serotonin reuptake inhibitors (ssris)   Family History  Problem Relation Age of Onset  . Breast cancer Neg Hx     Social History Social History   Tobacco Use  . Smoking status: Never Smoker  Substance Use Topics  . Alcohol use: No  . Drug use: No    Review of  Systems  Constitutional:   No fever  Cardiovascular:   No chest pain  Respiratory:   No dyspnea  ____________________________________________   PHYSICAL EXAM:  VITAL SIGNS: ED Triage Vitals  Enc Vitals Group     BP 08/13/20 1930 (!) 165/107     Pulse Rate 08/13/20 1930 86     Resp 08/13/20 1930 19     Temp 08/13/20 1930 98.2 F (36.8 C)     Temp Source 08/13/20 1930 Oral     SpO2 08/13/20 1930 97 %     Weight 08/13/20 1927 195 lb 1.7 oz (88.5 kg)     Height 08/13/20 1927 5\' 10"  (1.778 m)     Head Circumference --      Peak Flow --      Pain Score 08/13/20 1925 0     Pain Loc --      Pain Edu? --      Excl. in GC? --     Vital signs reviewed, nursing assessments reviewed.   Constitutional:   Alert and oriented. Non-toxic appearance. Eyes:   Conjunctivae are normal. EOMI. ENT   Neurologic:   Normal speech and language.  Motor grossly intact. No acute focal neurologic deficits are appreciated.   ____________________________________________    LABS (pertinent positives/negatives) (all labs ordered are listed, but only abnormal results are  displayed) Labs Reviewed  SARS CORONAVIRUS 2 BY RT PCR (HOSPITAL ORDER, PERFORMED IN [facility])   ____________________________________________   EKG  ____________________________________________    RADIOLOGY  No results found.  ____________________________________________   PROCEDURES Procedures  ____________________________________________  CLINICAL IMPRESSION / ASSESSMENT AND PLAN / ED COURSE  Pertinent labs & imaging results that were available during my care of the patient were reviewed by me and considered in my medical decision making (see chart for details).  Amber Roberson was evaluated in Emergency Department on 08/13/2020 for the symptoms described in the history of present illness. She was evaluated in the context of the global COVID-19 pandemic, which necessitated consideration that the  patient might be at risk for infection with the SARS-CoV-2 virus that causes COVID-19. Institutional protocols and algorithms that pertain to the evaluation of patients at risk for COVID-19 are in a state of rapid change based on information released by regulatory bodies including the CDC and federal and state organizations. These policies and algorithms were followed during the patient's care in the ED.   Patient presents for Covid testing, unable to obtain a Covid result in time to allow for travel for family emergency tomorrow.  We will send a PCR to the lab.  Vital signs unremarkable.  No other work-up needed, stable for continued outpatient follow-up.      ____________________________________________   FINAL CLINICAL IMPRESSION(S) / ED DIAGNOSES    Final diagnoses:  Encounter for laboratory testing for COVID-19 virus     ED Discharge Orders    None      Portions of this note were generated with dragon dictation software. Dictation errors may occur despite best attempts at proofreading.   Sharman Cheek, MD 08/13/20 2047

## 2020-09-14 ENCOUNTER — Other Ambulatory Visit: Payer: Self-pay | Admitting: Internal Medicine

## 2020-09-14 DIAGNOSIS — Z1231 Encounter for screening mammogram for malignant neoplasm of breast: Secondary | ICD-10-CM

## 2021-06-30 ENCOUNTER — Other Ambulatory Visit
Admission: RE | Admit: 2021-06-30 | Discharge: 2021-06-30 | Disposition: A | Discharge status: Home / Self Care | Payer: Federal, State, Local not specified - PPO | Source: Ambulatory Visit | Attending: Pediatrics | Admitting: Pediatrics

## 2021-06-30 DIAGNOSIS — R0602 Shortness of breath: Secondary | ICD-10-CM | POA: Insufficient documentation

## 2021-06-30 DIAGNOSIS — R0789 Other chest pain: Secondary | ICD-10-CM | POA: Insufficient documentation

## 2021-06-30 LAB — D-DIMER, QUANTITATIVE: D-Dimer, Quant: 0.34 ug/mL-FEU (ref 0.00–0.50)

## 2021-06-30 LAB — TROPONIN I (HIGH SENSITIVITY): Troponin I (High Sensitivity): 3 ng/L (ref <18)

## 2022-01-16 ENCOUNTER — Other Ambulatory Visit: Payer: Self-pay | Admitting: Internal Medicine

## 2022-01-16 DIAGNOSIS — Z1231 Encounter for screening mammogram for malignant neoplasm of breast: Secondary | ICD-10-CM

## 2022-02-21 ENCOUNTER — Ambulatory Visit
Admission: RE | Admit: 2022-02-21 | Discharge: 2022-02-21 | Disposition: A | Discharge status: Home / Self Care | Payer: Federal, State, Local not specified - PPO | Source: Ambulatory Visit | Attending: Internal Medicine | Admitting: Internal Medicine

## 2022-02-21 ENCOUNTER — Other Ambulatory Visit: Payer: Self-pay

## 2022-02-21 DIAGNOSIS — Z1231 Encounter for screening mammogram for malignant neoplasm of breast: Secondary | ICD-10-CM | POA: Insufficient documentation

## 2023-01-15 ENCOUNTER — Other Ambulatory Visit: Payer: Self-pay | Admitting: Internal Medicine

## 2023-01-15 DIAGNOSIS — Z1231 Encounter for screening mammogram for malignant neoplasm of breast: Secondary | ICD-10-CM

## 2023-02-25 ENCOUNTER — Ambulatory Visit
Admission: RE | Admit: 2023-02-25 | Discharge: 2023-02-25 | Disposition: A | Discharge status: Home / Self Care | Payer: Federal, State, Local not specified - PPO | Source: Ambulatory Visit | Attending: Internal Medicine | Admitting: Internal Medicine

## 2023-02-25 DIAGNOSIS — Z1231 Encounter for screening mammogram for malignant neoplasm of breast: Secondary | ICD-10-CM

## 2023-02-28 ENCOUNTER — Other Ambulatory Visit: Payer: Self-pay | Admitting: Internal Medicine

## 2023-02-28 DIAGNOSIS — N6489 Other specified disorders of breast: Secondary | ICD-10-CM

## 2023-02-28 DIAGNOSIS — R928 Other abnormal and inconclusive findings on diagnostic imaging of breast: Secondary | ICD-10-CM

## 2023-03-06 ENCOUNTER — Ambulatory Visit
Admission: RE | Admit: 2023-03-06 | Discharge: 2023-03-06 | Disposition: A | Discharge status: Home / Self Care | Payer: Federal, State, Local not specified - PPO | Source: Ambulatory Visit | Attending: Internal Medicine | Admitting: Internal Medicine

## 2023-03-06 DIAGNOSIS — N6489 Other specified disorders of breast: Secondary | ICD-10-CM | POA: Diagnosis present

## 2023-03-06 DIAGNOSIS — R928 Other abnormal and inconclusive findings on diagnostic imaging of breast: Secondary | ICD-10-CM | POA: Diagnosis present

## 2024-05-15 ENCOUNTER — Other Ambulatory Visit: Payer: Self-pay | Admitting: Internal Medicine

## 2024-05-15 DIAGNOSIS — Z1231 Encounter for screening mammogram for malignant neoplasm of breast: Secondary | ICD-10-CM

## 2024-05-28 ENCOUNTER — Ambulatory Visit
Admission: RE | Admit: 2024-05-28 | Discharge: 2024-05-28 | Disposition: A | Discharge status: Home / Self Care | Source: Ambulatory Visit | Attending: Internal Medicine | Admitting: Internal Medicine

## 2024-05-28 DIAGNOSIS — Z1231 Encounter for screening mammogram for malignant neoplasm of breast: Secondary | ICD-10-CM | POA: Diagnosis present
